# Patient Record
Sex: Female | Born: 1960 | Race: White | Hispanic: No | Marital: Married | Smoking: Never smoker
Health system: Southern US, Community
[De-identification: ages and names within clinical notes are randomized; demographics above are authoritative.]

## PROBLEM LIST (undated history)

## (undated) DIAGNOSIS — K5900 Constipation, unspecified: Secondary | ICD-10-CM

## (undated) DIAGNOSIS — K219 Gastro-esophageal reflux disease without esophagitis: Secondary | ICD-10-CM

## (undated) DIAGNOSIS — R112 Nausea with vomiting, unspecified: Secondary | ICD-10-CM

## (undated) DIAGNOSIS — E079 Disorder of thyroid, unspecified: Secondary | ICD-10-CM

## (undated) DIAGNOSIS — Z9889 Other specified postprocedural states: Secondary | ICD-10-CM

## (undated) HISTORY — PX: HERNIA REPAIR: SHX51

## (undated) HISTORY — DX: Gastro-esophageal reflux disease without esophagitis: K21.9

## (undated) HISTORY — PX: ANKLE SURGERY: SHX546

---

## 2003-01-24 ENCOUNTER — Other Ambulatory Visit: Admission: RE | Admit: 2003-01-24 | Discharge: 2003-01-24 | Payer: Self-pay | Admitting: Family Medicine

## 2004-01-29 ENCOUNTER — Other Ambulatory Visit: Admission: RE | Admit: 2004-01-29 | Discharge: 2004-01-29 | Payer: Self-pay | Admitting: Family Medicine

## 2005-04-21 ENCOUNTER — Other Ambulatory Visit: Admission: RE | Admit: 2005-04-21 | Discharge: 2005-04-21 | Payer: Self-pay | Admitting: Family Medicine

## 2007-04-18 ENCOUNTER — Other Ambulatory Visit: Admission: RE | Admit: 2007-04-18 | Discharge: 2007-04-18 | Payer: Self-pay | Admitting: Family Medicine

## 2013-09-12 ENCOUNTER — Other Ambulatory Visit: Payer: Self-pay | Admitting: Family Medicine

## 2013-09-12 ENCOUNTER — Other Ambulatory Visit (HOSPITAL_COMMUNITY)
Admission: RE | Admit: 2013-09-12 | Discharge: 2013-09-12 | Disposition: A | Discharge status: Home / Self Care | Payer: 59 | Source: Ambulatory Visit | Attending: Family Medicine | Admitting: Family Medicine

## 2013-09-12 DIAGNOSIS — Z1151 Encounter for screening for human papillomavirus (HPV): Secondary | ICD-10-CM | POA: Insufficient documentation

## 2013-09-12 DIAGNOSIS — Z124 Encounter for screening for malignant neoplasm of cervix: Secondary | ICD-10-CM | POA: Insufficient documentation

## 2015-12-03 ENCOUNTER — Encounter: Payer: Self-pay | Admitting: Dietician

## 2015-12-03 ENCOUNTER — Encounter: Payer: Commercial Managed Care - HMO | Attending: General Surgery | Admitting: Dietician

## 2015-12-03 DIAGNOSIS — Z01818 Encounter for other preprocedural examination: Secondary | ICD-10-CM | POA: Diagnosis present

## 2015-12-03 NOTE — Progress Notes (Signed)
  Pre-Op Assessment Visit:  Pre-Operative Sleeve gastrectomy Surgery  Medical Nutrition Therapy:  Appt start time: 1000   End time:  [id number].  Patient was seen on 12/03/2015 for Pre-Operative Nutrition Assessment. Assessment and letter of approval faxed to [facility] Surgery Bariatric Surgery Program coordinator on 12/03/2015.   Preferred Learning Style:   No preference indicated   Learning Readiness:   Ready  Handouts given during visit include:  Pre-Op Goals Bariatric Surgery Protein Shakes   During the appointment today the following Pre-Op Goals were reviewed with the patient: Maintain or lose weight as instructed by your surgeon Make healthy food choices Begin to limit portion sizes Limited concentrated sugars and fried foods Keep fat/sugar in the single digits per serving on   food labels Practice CHEWING your food  (aim for 30 chews per bite or until applesauce consistency) Practice not drinking 15 minutes before, during, and 30 minutes after each meal/snack Avoid all carbonated beverages  Avoid/limit caffeinated beverages  Avoid all sugar-sweetened beverages Consume 3 meals per day; eat every 3-5 hours Make a list of non-food related activities Aim for 64-100 ounces of FLUID daily  Aim for at least 60-80 grams of PROTEIN daily Look for a liquid protein source that contain ?15 g protein and ?5 g carbohydrate  (ex: shakes, drinks, shots)  Patient-Centered Goals:  -More active -Being more comfortable socially and professionally -Playing with grandkids -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

## 2015-12-30 ENCOUNTER — Encounter: Payer: Commercial Managed Care - HMO | Attending: General Surgery | Admitting: Dietician

## 2015-12-30 DIAGNOSIS — Z01818 Encounter for other preprocedural examination: Secondary | ICD-10-CM | POA: Insufficient documentation

## 2015-12-30 NOTE — Progress Notes (Signed)
Supervised Weight Loss:  Appt start time: 1100 end time:  1115  SWL visit 1:  Primary concerns today: Hannah Skinner returns for her first SWL visit in preparation for sleeve gastrectomy having maintained her weight. She states that she has been trying to focus more on protein foods. Practicing chewing thoroughly and working on drinking more water. Bought some Premier protein shakes but has not tried them yet.    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   Nutritional Diagnosis:  [place]-3.3 Overweight/obesity related to past poor dietary habits and physical inactivity as evidenced by patient in SWL for pending bariatric surgery following dietary guidelines for continued weight loss.     Intervention:  Nutrition counseling provided. Goals: -Increase physical activity  -Aim for 1 mile 3x a week in the evenings  -Ride bike when you are unable to walk -Keep working on chewing well, eating slowly, and taking tiny bites -Keep working on staying hydrated throughout the day (water or water with sugar free flavoring) -Start working on [employer] out [employer] given during visit include:  none  Monitoring/Evaluation:  Dietary intake, exercise, and body weight in 4 week(s).

## 2015-12-30 NOTE — Patient Instructions (Addendum)
-  Increase physical activity  -Aim for 1 mile 3x a week in the evenings  -Ride bike when you are unable to walk  -Keep working on chewing well, eating slowly, and taking tiny bites  -Keep working on staying hydrated throughout the day (water or water with sugar free flavoring) -Start working on [employer] out [employer]

## 2015-12-31 ENCOUNTER — Ambulatory Visit: Payer: Commercial Managed Care - HMO | Admitting: Dietician

## 2016-02-03 ENCOUNTER — Other Ambulatory Visit: Payer: Self-pay | Admitting: Physician Assistant

## 2016-02-03 DIAGNOSIS — R1313 Dysphagia, pharyngeal phase: Secondary | ICD-10-CM

## 2016-02-12 ENCOUNTER — Ambulatory Visit
Admission: RE | Admit: 2016-02-12 | Discharge: 2016-02-12 | Disposition: A | Discharge status: Home / Self Care | Payer: Commercial Managed Care - HMO | Source: Ambulatory Visit | Attending: Physician Assistant | Admitting: Physician Assistant

## 2016-02-12 DIAGNOSIS — R1313 Dysphagia, pharyngeal phase: Secondary | ICD-10-CM

## 2016-03-02 ENCOUNTER — Ambulatory Visit: Payer: Commercial Managed Care - HMO | Admitting: Dietician

## 2016-03-14 ENCOUNTER — Encounter (HOSPITAL_COMMUNITY): Payer: Self-pay | Admitting: *Deleted

## 2016-03-14 NOTE — Progress Notes (Signed)
Pt denies cardiac disease, chest pain or sob.

## 2016-03-15 ENCOUNTER — Encounter (HOSPITAL_COMMUNITY)
Admission: RE | Disposition: A | Discharge status: Home / Self Care | Payer: Self-pay | Source: Ambulatory Visit | Attending: Gastroenterology

## 2016-03-15 ENCOUNTER — Ambulatory Visit (HOSPITAL_COMMUNITY): Payer: Commercial Managed Care - HMO | Admitting: Certified Registered Nurse Anesthetist

## 2016-03-15 ENCOUNTER — Ambulatory Visit (HOSPITAL_COMMUNITY)
Admission: RE | Admit: 2016-03-15 | Discharge: 2016-03-15 | Disposition: A | Discharge status: Home / Self Care | Payer: Commercial Managed Care - HMO | Source: Ambulatory Visit | Attending: Gastroenterology | Admitting: Gastroenterology

## 2016-03-15 ENCOUNTER — Encounter (HOSPITAL_COMMUNITY): Payer: Self-pay

## 2016-03-15 DIAGNOSIS — K645 Perianal venous thrombosis: Secondary | ICD-10-CM | POA: Insufficient documentation

## 2016-03-15 DIAGNOSIS — K219 Gastro-esophageal reflux disease without esophagitis: Secondary | ICD-10-CM | POA: Diagnosis not present

## 2016-03-15 DIAGNOSIS — Z1211 Encounter for screening for malignant neoplasm of colon: Secondary | ICD-10-CM | POA: Insufficient documentation

## 2016-03-15 DIAGNOSIS — Z6841 Body Mass Index (BMI) 40.0 and over, adult: Secondary | ICD-10-CM | POA: Diagnosis not present

## 2016-03-15 HISTORY — PX: COLONOSCOPY WITH PROPOFOL: SHX5780

## 2016-03-15 HISTORY — DX: Nausea with vomiting, unspecified: R11.2

## 2016-03-15 HISTORY — DX: Constipation, unspecified: K59.00

## 2016-03-15 HISTORY — DX: Disorder of thyroid, unspecified: E07.9

## 2016-03-15 HISTORY — DX: Other specified postprocedural states: Z98.890

## 2016-03-15 SURGERY — COLONOSCOPY WITH PROPOFOL
Anesthesia: Monitor Anesthesia Care

## 2016-03-15 MED ORDER — LACTATED RINGERS IV SOLN
INTRAVENOUS | Status: DC | PRN
  Administered 2016-03-15: 09:00:00 via INTRAVENOUS

## 2016-03-15 MED ORDER — PROMETHAZINE HCL 25 MG/ML IJ SOLN: 6.2500 mg | INTRAMUSCULAR | Status: DC | PRN

## 2016-03-15 MED ORDER — FENTANYL CITRATE (PF) 100 MCG/2ML IJ SOLN: 25.0000 ug | INTRAMUSCULAR | Status: DC | PRN

## 2016-03-15 MED ORDER — PROPOFOL 500 MG/50ML IV EMUL
INTRAVENOUS | Status: DC | PRN
  Administered 2016-03-15: 75 ug/kg/min via INTRAVENOUS

## 2016-03-15 MED ORDER — PROPOFOL 10 MG/ML IV BOLUS
INTRAVENOUS | Status: DC | PRN
  Administered 2016-03-15: 10 mg via INTRAVENOUS

## 2016-03-15 MED ORDER — SODIUM CHLORIDE 0.9 % IV SOLN: INTRAVENOUS | Status: DC

## 2016-03-15 MED ORDER — LIDOCAINE HCL (CARDIAC) 20 MG/ML IV SOLN
INTRAVENOUS | Status: DC | PRN
  Administered 2016-03-15: 50 mg via INTRATRACHEAL

## 2016-03-15 NOTE — Op Note (Signed)
[facility] Patient Name: Hannah Skinner Procedure Date : 03/15/2016 MRN: [id number] Attending MD: Wonda Horner , MD Date of Birth: 1960-11-19 CSN: [id number] Age: 55 Admit Type: Outpatient Procedure:                Colonoscopy Indications:              Screening for colorectal malignant neoplasm Providers:                Wonda Horner, MD, Cleda Daub, RN, Janie                            Billups, Technician, Tressia Miners, CRNA Referring MD:              Medicines:                Propofol per Anesthesia Complications:            No immediate complications. Estimated Blood Loss:     Estimated blood loss: none. Procedure:                Pre-Anesthesia Assessment:                           - Prior to the procedure, a History and Physical                            was performed, and patient medications and                            allergies were reviewed. The patient's tolerance of                            previous anesthesia was also reviewed. The risks                            and benefits of the procedure and the sedation                            options and risks were discussed with the patient.                            All questions were answered, and informed consent                            was obtained. Prior Anticoagulants: The patient has                            taken no previous anticoagulant or antiplatelet                            agents. ASA Grade Assessment: III - A patient with                            severe systemic disease. After reviewing the risks  and benefits, the patient was deemed in                            satisfactory condition to undergo the procedure.                           After obtaining informed consent, the colonoscope                            was passed under direct vision. Throughout the                            procedure, the patient's blood pressure, pulse, and               oxygen saturations were monitored continuously. The                            [id number] scope was introduced through                            the anus and advanced to the the cecum, identified                            by appendiceal orifice and ileocecal valve. The                            ileocecal valve, appendiceal orifice, and rectum                            were photographed. The colonoscopy was performed                            without difficulty. The patient tolerated the                            procedure well. The quality of the bowel                            preparation was good. Scope In: 9:32:25 AM Scope Out: 9:52:59 AM Scope Withdrawal Time: 0 hours 9 minutes 9 seconds  Total Procedure Duration: 0 hours 20 minutes 34 seconds  Findings:      The perianal exam findings include thrombosed external hemorrhoids.      The colon (entire examined portion) appeared normal. Impression:               - Thrombosed external hemorrhoids found on perianal                            exam.                           - The entire examined colon is normal.                           - No specimens collected. Moderate Sedation:      .  Recommendation:           - Resume regular diet.                           - Continue present medications.                           - Repeat colonoscopy in 10 years for screening                            purposes. Procedure Code(s):        --- Professional ---                           [phone], Colonoscopy, flexible; diagnostic, including                            collection of specimen(s) by brushing or washing,                            when performed (separate procedure) Diagnosis Code(s):        --- Professional ---                           Z12.11, Encounter for screening for malignant                            neoplasm of colon                           K64.5, Perianal venous thrombosis CPT copyright 2016 American  Medical Association. All rights reserved. The codes documented in this report are preliminary and upon coder review may  be revised to meet current compliance requirements. Wonda Horner, MD 03/15/2016 9:58:11 AM This report has been signed electronically. Number of Addenda: 0

## 2016-03-15 NOTE — Anesthesia Procedure Notes (Signed)
Procedure Name: MAC Date/Time: 03/15/2016 9:15 AM Performed by: Tressia Miners LEFFEW Pre-anesthesia Checklist: Patient identified, Emergency Drugs available, Suction available, Timeout performed and Patient being monitored Patient Re-evaluated:Patient Re-evaluated prior to inductionOxygen Delivery Method: Simple face mask Placement Confirmation: positive ETCO2

## 2016-03-15 NOTE — H&P (Signed)
The patient is a 55 year old female who presents to the outpatient endoscopy department at the hospital for screening colonoscopy. This is her first screening colonoscopy. She is asymptomatic.  Physical  No acute distress  Heart regular rhythm no murmurs  Lungs clear  Abdomen is soft and nontender  Impression: 55 year old female referred for screening colonoscopy  Plan: Screening colonoscopy

## 2016-03-15 NOTE — Transfer of Care (Signed)
Immediate Anesthesia Transfer of Care Note  Patient: Hannah Skinner  Procedure(s) Performed: Procedure(s): COLONOSCOPY WITH PROPOFOL (N/A)  Patient Location: Endoscopy Unit  Anesthesia Type:MAC  Level of Consciousness: awake, alert , oriented, patient cooperative and responds to stimulation  Airway & Oxygen Therapy: Patient Spontanous Breathing and Patient connected to face mask oxygen  Post-op Assessment: Report given to RN, Post -op Vital signs reviewed and stable and Patient moving all extremities X 4  Post vital signs: Reviewed and stable  Last Vitals:  Vitals:   03/15/16 0756 03/15/16 1000  BP: 117/65 116/75  Pulse: 83 72  Resp: (!) 7 16    Last Pain:  Vitals:   03/15/16 0756  TempSrc: Oral         Complications: No apparent anesthesia complications

## 2016-03-15 NOTE — Discharge Instructions (Signed)
YOU HAD AN ENDOSCOPIC PROCEDURE TODAY: Refer to the procedure report and other information in the discharge instructions given to you for any specific questions about what was found during the examination. If this information does not answer your questions, please call [facility] office at [phone] to clarify.   YOU SHOULD EXPECT: Some feelings of bloating in the abdomen. Passage of more gas than usual. Walking can help get rid of the air that was put into your GI tract during the procedure and reduce the bloating. If you had a lower endoscopy (such as a colonoscopy or flexible sigmoidoscopy) you may notice spotting of blood in your stool or on the toilet paper. Some abdominal soreness may be present for a day or two, also.  DIET: Your first meal following the procedure should be a light meal and then it is ok to progress to your normal diet. A half-sandwich or bowl of soup is an example of a good first meal. Heavy or fried foods are harder to digest and may make you feel nauseous or bloated. Drink plenty of fluids but you should avoid alcoholic beverages for 24 hours. If you had a esophageal dilation, please see attached instructions for diet.   ACTIVITY: Your care partner should take you home directly after the procedure. You should plan to take it easy, moving slowly for the rest of the day. You can resume normal activity the day after the procedure however YOU SHOULD NOT DRIVE, use power tools, machinery or perform tasks that involve climbing or major physical exertion for 24 hours (because of the sedation medicines used during the test).   SYMPTOMS TO REPORT IMMEDIATELY: A gastroenterologist can be reached at any hour. Please call [phone]  for any of the following symptoms:  Following lower endoscopy (colonoscopy, flexible sigmoidoscopy) Excessive amounts of blood in the stool  Significant tenderness, worsening of abdominal pains  Swelling of the abdomen that is new, acute  Fever of 100 or  higher  Following upper endoscopy (EGD, EUS, ERCP, esophageal dilation) Vomiting of blood or coffee ground material  New, significant abdominal pain  New, significant chest pain or pain under the shoulder blades  Painful or persistently difficult swallowing  New shortness of breath  Black, tarry-looking or red, bloody stools  FOLLOW UP:  If any biopsies were taken you will be contacted by phone or by letter within the next 1-3 weeks. Call [phone]  if you have not heard about the biopsies in 3 weeks.  Please also call with any specific questions about appointments or follow up tests.    Colonoscopy, Care After These instructions give you information on caring for yourself after your procedure. Your doctor may also give you more specific instructions. Call your doctor if you have any problems or questions after your procedure. HOME CARE  Do not drive for 24 hours.  Do not sign important papers or use machinery for 24 hours.  You may shower.  You may go back to your usual activities, but go slower for the first 24 hours.  Take rest breaks often during the first 24 hours.  Walk around or use warm packs on your belly (abdomen) if you have belly cramping or gas.  Drink enough fluids to keep your pee (urine) clear or pale yellow.  Resume your normal diet. Avoid heavy or fried foods.  Avoid drinking alcohol for 24 hours or as told by your doctor.  Only take medicines as told by your doctor. If a tissue sample (biopsy) was  taken during the procedure:   Do not take aspirin or blood thinners for 7 days, or as told by your doctor.  Do not drink alcohol for 7 days, or as told by your doctor.  Eat soft foods for the first 24 hours. GET HELP IF: You still have a small amount of blood in your poop (stool) 2-3 days after the procedure. GET HELP RIGHT AWAY IF:  You have more than a small amount of blood in your poop.  You see clumps of tissue (blood clots) in your poop.  Your  belly is puffy (swollen).  You feel sick to your stomach (nauseous) or throw up (vomit).  You have a fever.  You have belly pain that gets worse and medicine does not help. MAKE SURE YOU:  Understand these instructions.  Will watch your condition.  Will get help right away if you are not doing well or get worse.   This information is not intended to replace advice given to you by your health care provider. Make sure you discuss any questions you have with your health care provider.   Document Released: 07/16/2010 Document Revised: 06/18/2013 Document Reviewed: 02/18/2013 Elsevier Interactive Patient Education 2016 [place] Monitored anesthesia care is an anesthesia service for a medical procedure. Anesthesia is the loss of the ability to feel pain. It is produced by medicines called anesthetics. It may affect a small area of your body (local anesthesia), a large area of your body (regional anesthesia), or your entire body (general anesthesia). The need for monitored anesthesia care depends your procedure, your condition, and the potential need for regional or general anesthesia. It is often provided during procedures where:   General anesthesia may be needed if there are complications. This is because you need special care when you are under general anesthesia.   You will be under local or regional anesthesia. This is so that you are able to have higher levels of anesthesia if needed.   You will receive calming medicines (sedatives). This is especially the case if sedatives are given to put you in a semi-conscious state of relaxation (deep sedation). This is because the amount of sedative needed to produce this state can be hard to predict. Too much of a sedative can produce general anesthesia. Monitored anesthesia care is performed by one or more health care providers who have special training in all types of anesthesia. You will need to meet with  these health care providers before your procedure. During this meeting, they will ask you about your medical history. They will also give you instructions to follow. (For example, you will need to stop eating and drinking before your procedure. You may also need to stop or change medicines you are taking.) During your procedure, your health care providers will stay with you. They will:   Watch your condition. This includes watching your blood pressure, breathing, and level of pain.   Diagnose and treat problems that occur.   Give medicines if they are needed. These may include calming medicines (sedatives) and anesthetics.   Make sure you are comfortable.  Having monitored anesthesia care does not necessarily mean that you will be under anesthesia. It does mean that your health care providers will be able to manage anesthesia if you need it or if it occurs. It also means that you will be able to have a different type of anesthesia than you are having if you need it. When your procedure is complete, your  health care providers will continue to watch your condition. They will make sure any medicines wear off before you are allowed to go home.    This information is not intended to replace advice given to you by your health care provider. Make sure you discuss any questions you have with your health care provider.   Document Released: 03/09/2005 Document Revised: 07/04/2014 Document Reviewed: 07/25/2012 Elsevier Interactive Patient Education [employer].

## 2016-03-15 NOTE — Anesthesia Preprocedure Evaluation (Addendum)
Anesthesia Evaluation  Patient identified by MRN, date of birth, ID band Patient awake    Reviewed: Allergy & Precautions, NPO status , Patient's Chart, lab work & pertinent test results  History of Anesthesia Complications (+) PONV and history of anesthetic complications  Airway Mallampati: III  TM Distance: >3 FB Neck ROM: Full    Dental  (+) Teeth Intact, Dental Advisory Given   Pulmonary neg pulmonary ROS,    Pulmonary exam normal breath sounds clear to auscultation       Cardiovascular negative cardio ROS   Rhythm:Regular Rate:Normal     Neuro/Psych negative neurological ROS     GI/Hepatic Neg liver ROS, GERD  Controlled,  Endo/Other  negative endocrine ROSHypothyroidism (has been off of medication for 8 months) Morbid obesity  Renal/GU negative Renal ROS     Musculoskeletal   Abdominal (+) + obese,   Peds  Hematology negative hematology ROS (+)   Anesthesia Other Findings   Reproductive/Obstetrics                            Anesthesia Physical Anesthesia Plan  ASA: III  Anesthesia Plan: MAC   Post-op Pain Management:    Induction: Intravenous  Airway Management Planned: Natural Airway and Nasal Cannula  Additional Equipment:   Intra-op Plan:   Post-operative Plan: Extubation in OR  Informed Consent: I have reviewed the patients History and Physical, chart, labs and discussed the procedure including the risks, benefits and alternatives for the proposed anesthesia with the patient or authorized representative who has indicated his/her understanding and acceptance.   Dental advisory given  Plan Discussed with: CRNA  Anesthesia Plan Comments:        Anesthesia Quick Evaluation

## 2016-03-15 NOTE — Anesthesia Postprocedure Evaluation (Signed)
Anesthesia Post Note  Patient: Hannah Skinner  Procedure(s) Performed: Procedure(s) (LRB): COLONOSCOPY WITH PROPOFOL (N/A)  Patient location during evaluation: PACU Anesthesia Type: MAC Level of consciousness: awake and alert Pain management: pain level controlled Vital Signs Assessment: post-procedure vital signs reviewed and stable Respiratory status: spontaneous breathing, nonlabored ventilation and respiratory function stable Cardiovascular status: stable and blood pressure returned to baseline Anesthetic complications: no    Last Vitals:  Vitals:   03/15/16 1010 03/15/16 1019  BP: 131/79 (!) 101/55  Pulse: 71 66  Resp: 14 (!) 21    Last Pain:  Vitals:   03/15/16 0756  TempSrc: Oral                 Nilda Simmer

## 2016-03-16 ENCOUNTER — Encounter (HOSPITAL_COMMUNITY): Payer: Self-pay | Admitting: Gastroenterology

## 2016-08-09 ENCOUNTER — Other Ambulatory Visit (HOSPITAL_COMMUNITY): Payer: Self-pay | Admitting: General Surgery

## 2016-08-29 ENCOUNTER — Ambulatory Visit (HOSPITAL_COMMUNITY)
Admission: RE | Admit: 2016-08-29 | Discharge: 2016-08-29 | Disposition: A | Discharge status: Home / Self Care | Payer: Commercial Managed Care - HMO | Source: Ambulatory Visit | Attending: General Surgery | Admitting: General Surgery

## 2016-08-29 ENCOUNTER — Other Ambulatory Visit: Payer: Self-pay

## 2016-08-29 DIAGNOSIS — I252 Old myocardial infarction: Secondary | ICD-10-CM | POA: Diagnosis not present

## 2016-08-29 DIAGNOSIS — I700 Atherosclerosis of aorta: Secondary | ICD-10-CM | POA: Insufficient documentation

## 2016-08-29 DIAGNOSIS — K449 Diaphragmatic hernia without obstruction or gangrene: Secondary | ICD-10-CM | POA: Diagnosis not present

## 2016-08-29 DIAGNOSIS — Z01812 Encounter for preprocedural laboratory examination: Secondary | ICD-10-CM | POA: Diagnosis not present

## 2016-08-29 DIAGNOSIS — Z01818 Encounter for other preprocedural examination: Secondary | ICD-10-CM | POA: Diagnosis present

## 2016-09-19 ENCOUNTER — Encounter: Payer: 59 | Attending: General Surgery | Admitting: Skilled Nursing Facility1

## 2016-09-19 DIAGNOSIS — Z713 Dietary counseling and surveillance: Secondary | ICD-10-CM | POA: Insufficient documentation

## 2016-09-19 DIAGNOSIS — E669 Obesity, unspecified: Secondary | ICD-10-CM

## 2016-09-19 DIAGNOSIS — Z6841 Body Mass Index (BMI) 40.0 and over, adult: Secondary | ICD-10-CM | POA: Diagnosis not present

## 2016-09-22 ENCOUNTER — Encounter: Payer: Self-pay | Admitting: Skilled Nursing Facility1

## 2016-09-22 NOTE — Progress Notes (Signed)
  Pre-Operative Nutrition Class: 09/19/2016 Appt start time: 1610   End time:  1830.  Patient was seen on 09/19/2016 for Pre-Operative Bariatric Surgery Education at [facility].   Surgery date: 10/04/2016 Surgery type: gastric sleeve Start weight at [facility]: 312 Weight today: 314.8   TANITA  BODY COMP RESULTS     BMI (kg/m^2)    Fat Mass (lbs)    Fat Free Mass (lbs)    Total Body Water (lbs)    Samples given per MNT protocol. Patient educated on appropriate usage: Opurity Multivitamin Lot # [id number] Exp:09/13/2017  Celebrate Vitamins Calcium Citrate Lot # [id number] Exp: 09/2017  Premeir Protein  Lot #[id number] Exp: 03/nov/2018  The following the learning objectives were met by the patient during this course:  Identify Pre-Op Dietary Goals and will begin 2 weeks pre-operatively  Identify appropriate sources of fluids and proteins   State protein recommendations and appropriate sources pre and post-operatively  Identify Post-Operative Dietary Goals and will follow for 2 weeks post-operatively  Identify appropriate multivitamin and calcium sources  Describe the need for physical activity post-operatively and will follow MD recommendations  State when to call healthcare provider regarding medication questions or post-operative complications  Handouts given during class include:  Pre-Op Bariatric Surgery Diet Handout  Protein Shake Handout  Post-Op Bariatric Surgery Nutrition Handout  [facility] Program Information Flyer  Support Group Information Flyer  [facility] Bariatric Supplements Price List  Follow-Up Plan: Patient will follow-up at [facility] 2 weeks post operatively for diet advancement per MD.

## 2016-09-29 ENCOUNTER — Ambulatory Visit: Payer: Self-pay | Admitting: General Surgery

## 2016-09-29 NOTE — Progress Notes (Signed)
CXR 08-29-16 epic EKG 08-29-16 epic

## 2016-09-29 NOTE — Progress Notes (Signed)
Please place orders in epic for surgery on 10-04-16. Patient has pre-op appt on 10-03-16

## 2016-09-29 NOTE — Patient Instructions (Addendum)
Hannah Skinner  09/29/2016   Your procedure is scheduled on: 10-04-16  Report to [facility] Main  Entrance take [facility]  elevators to 3rd floor to  [place] at 530AM.  Call this number if you have problems the morning of surgery [phone]   Remember: ONLY 1 PERSON MAY GO WITH YOU TO SHORT STAY TO GET  READY MORNING OF [place].  Do not eat food or drink liquids :After Midnight.     Take these medicines the morning of surgery with A SIP OF WATER: Claritin if needed                             You may not have any metal on your body including hair pins and              piercings  Do not wear jewelry, make-up, lotions, powders or perfumes, deodorant             Do not wear nail polish.  Do not shave  48 hours prior to surgery.          Do not bring valuables to the hospital. [place].  Contacts, dentures or bridgework may not be worn into surgery.  Leave suitcase in the car. After surgery it may be brought to your room.               Please read over the following fact sheets you were given: _____________________________________________________________________             [facility] - Preparing for Surgery Before surgery, you can play an important role.  Because skin is not sterile, your skin needs to be as free of germs as possible.  You can reduce the number of germs on your skin by washing with CHG (chlorahexidine gluconate) soap before surgery.  CHG is an antiseptic cleaner which kills germs and bonds with the skin to continue killing germs even after washing. Please DO NOT use if you have an allergy to CHG or antibacterial soaps.  If your skin becomes reddened/irritated stop using the CHG and inform your nurse when you arrive at Short Stay. Do not shave (including legs and underarms) for at least 48 hours prior to the first CHG shower.  You may shave your face/neck. Please follow these  instructions carefully:  1.  Shower with CHG Soap the night before surgery and the  morning of Surgery.  2.  If you choose to wash your hair, wash your hair first as usual with your  normal  shampoo.  3.  After you shampoo, rinse your hair and body thoroughly to remove the  shampoo.                           4.  Use CHG as you would any other liquid soap.  You can apply chg directly  to the skin and wash                       Gently with a scrungie or clean washcloth.  5.  Apply the CHG Soap to your body ONLY FROM THE NECK DOWN.   Do not use on face/ open  Wound or open sores. Avoid contact with eyes, ears mouth and genitals (private parts).                       Wash face,  Genitals (private parts) with your normal soap.             6.  Wash thoroughly, paying special attention to the area where your surgery  will be performed.  7.  Thoroughly rinse your body with warm water from the neck down.  8.  DO NOT shower/wash with your normal soap after using and rinsing off  the CHG Soap.                9.  Pat yourself dry with a clean towel.            10.  Wear clean pajamas.            11.  Place clean sheets on your bed the night of your first shower and do not  sleep with pets. Day of Surgery : Do not apply any lotions/deodorants the morning of surgery.  Please wear clean clothes to the hospital/surgery center.  FAILURE TO FOLLOW THESE INSTRUCTIONS MAY RESULT IN THE CANCELLATION OF YOUR SURGERY PATIENT SIGNATURE_________________________________  NURSE SIGNATURE__________________________________  ________________________________________________________________________

## 2016-10-03 ENCOUNTER — Encounter (HOSPITAL_COMMUNITY)
Admission: RE | Admit: 2016-10-03 | Discharge: 2016-10-03 | Disposition: A | Discharge status: Home / Self Care | Payer: 59 | Source: Ambulatory Visit | Attending: General Surgery | Admitting: General Surgery

## 2016-10-03 ENCOUNTER — Encounter (HOSPITAL_COMMUNITY): Payer: Self-pay

## 2016-10-03 DIAGNOSIS — K219 Gastro-esophageal reflux disease without esophagitis: Secondary | ICD-10-CM | POA: Diagnosis not present

## 2016-10-03 DIAGNOSIS — Z6841 Body Mass Index (BMI) 40.0 and over, adult: Secondary | ICD-10-CM | POA: Diagnosis not present

## 2016-10-03 DIAGNOSIS — K449 Diaphragmatic hernia without obstruction or gangrene: Secondary | ICD-10-CM | POA: Diagnosis not present

## 2016-10-03 DIAGNOSIS — I10 Essential (primary) hypertension: Secondary | ICD-10-CM | POA: Diagnosis not present

## 2016-10-03 DIAGNOSIS — Z820 Family history of epilepsy and other diseases of the nervous system: Secondary | ICD-10-CM | POA: Diagnosis not present

## 2016-10-03 DIAGNOSIS — E039 Hypothyroidism, unspecified: Secondary | ICD-10-CM | POA: Diagnosis not present

## 2016-10-03 DIAGNOSIS — D1779 Benign lipomatous neoplasm of other sites: Secondary | ICD-10-CM | POA: Diagnosis not present

## 2016-10-03 LAB — COMPREHENSIVE METABOLIC PANEL
ALBUMIN: 4.5 g/dL (ref 3.5–5.0)
ALT: 21 U/L (ref 14–54)
ANION GAP: 9 (ref 5–15)
AST: 24 U/L (ref 15–41)
Alkaline Phosphatase: 70 U/L (ref 38–126)
BUN: 18 mg/dL (ref 6–20)
CHLORIDE: 107 mmol/L (ref 101–111)
CO2: 24 mmol/L (ref 22–32)
Calcium: 9.6 mg/dL (ref 8.9–10.3)
Creatinine, Ser: 0.92 mg/dL (ref 0.44–1.00)
GFR calc Af Amer: >60 mL/min (ref >60)
GFR calc non Af Amer: >60 mL/min (ref >60)
GLUCOSE: 98 mg/dL (ref 65–99)
POTASSIUM: 4.1 mmol/L (ref 3.5–5.1)
SODIUM: 140 mmol/L (ref 135–145)
Total Bilirubin: 0.6 mg/dL (ref 0.3–1.2)
Total Protein: 8 g/dL (ref 6.5–8.1)

## 2016-10-03 LAB — CBC WITH DIFFERENTIAL/PLATELET
BASOS ABS: 0 10*3/uL (ref 0.0–0.1)
BASOS PCT: 0 %
EOS ABS: 0.1 10*3/uL (ref 0.0–0.7)
Eosinophils Relative: 2 %
HCT: 38.9 % (ref 36.0–46.0)
Hemoglobin: 13.3 g/dL (ref 12.0–15.0)
Lymphocytes Relative: 25 %
Lymphs Abs: 1.4 10*3/uL (ref 0.7–4.0)
MCH: 27.9 pg (ref 26.0–34.0)
MCHC: 34.2 g/dL (ref 30.0–36.0)
MCV: 81.7 fL (ref 78.0–100.0)
MONO ABS: 0.4 10*3/uL (ref 0.1–1.0)
MONOS PCT: 8 %
Neutro Abs: 3.8 10*3/uL (ref 1.7–7.7)
Neutrophils Relative %: 65 %
Platelets: 286 10*3/uL (ref 150–400)
RBC: 4.76 MIL/uL (ref 3.87–5.11)
RDW: 13 % (ref 11.5–15.5)
WBC: 5.7 10*3/uL (ref 4.0–10.5)

## 2016-10-03 LAB — GLUCOSE, CAPILLARY: Glucose-Capillary: 105 mg/dL — ABNORMAL HIGH (ref 65–99)

## 2016-10-03 NOTE — Anesthesia Preprocedure Evaluation (Signed)
Anesthesia Evaluation  Patient identified by MRN, date of birth, ID band Patient awake    Reviewed: Allergy & Precautions, H&P , Patient's Chart, lab work & pertinent test results, reviewed documented beta blocker date and time   Airway Mallampati: II  TM Distance: >3 FB Neck ROM: full    Dental no notable dental hx.    Pulmonary    Pulmonary exam normal breath sounds clear to auscultation       Cardiovascular  Rhythm:regular Rate:Normal     Neuro/Psych    GI/Hepatic   Endo/Other    Renal/GU      Musculoskeletal   Abdominal   Peds  Hematology   Anesthesia Other Findings   Reproductive/Obstetrics                             Anesthesia Physical Anesthesia Plan  ASA: III  Anesthesia Plan: General   Post-op Pain Management:    Induction: Intravenous  Airway Management Planned: Oral ETT and Video Laryngoscope Planned  Additional Equipment:   Intra-op Plan:   Post-operative Plan: Extubation in OR  Informed Consent: I have reviewed the patients History and Physical, chart, labs and discussed the procedure including the risks, benefits and alternatives for the proposed anesthesia with the patient or authorized representative who has indicated his/her understanding and acceptance.   Dental Advisory Given  Plan Discussed with: CRNA and Surgeon  Anesthesia Plan Comments: (  )        Anesthesia Quick Evaluation

## 2016-10-04 ENCOUNTER — Encounter (HOSPITAL_COMMUNITY): Payer: Self-pay | Admitting: *Deleted

## 2016-10-04 ENCOUNTER — Observation Stay (HOSPITAL_COMMUNITY)
Admission: RE | Admit: 2016-10-04 | Discharge: 2016-10-05 | DRG: 621 | Disposition: A | Discharge status: Home / Self Care | Payer: 59 | Source: Ambulatory Visit | Attending: General Surgery | Admitting: General Surgery

## 2016-10-04 ENCOUNTER — Inpatient Hospital Stay (HOSPITAL_COMMUNITY): Payer: 59 | Admitting: Anesthesiology

## 2016-10-04 ENCOUNTER — Encounter (HOSPITAL_COMMUNITY)
Admission: RE | Disposition: A | Discharge status: Home / Self Care | Payer: Self-pay | Source: Ambulatory Visit | Attending: General Surgery

## 2016-10-04 DIAGNOSIS — K219 Gastro-esophageal reflux disease without esophagitis: Secondary | ICD-10-CM | POA: Insufficient documentation

## 2016-10-04 DIAGNOSIS — K449 Diaphragmatic hernia without obstruction or gangrene: Secondary | ICD-10-CM | POA: Insufficient documentation

## 2016-10-04 DIAGNOSIS — Z820 Family history of epilepsy and other diseases of the nervous system: Secondary | ICD-10-CM | POA: Insufficient documentation

## 2016-10-04 DIAGNOSIS — I10 Essential (primary) hypertension: Secondary | ICD-10-CM | POA: Insufficient documentation

## 2016-10-04 DIAGNOSIS — D1779 Benign lipomatous neoplasm of other sites: Secondary | ICD-10-CM | POA: Insufficient documentation

## 2016-10-04 DIAGNOSIS — E039 Hypothyroidism, unspecified: Secondary | ICD-10-CM | POA: Insufficient documentation

## 2016-10-04 DIAGNOSIS — Z6841 Body Mass Index (BMI) 40.0 and over, adult: Secondary | ICD-10-CM | POA: Insufficient documentation

## 2016-10-04 HISTORY — PX: LAPAROSCOPIC GASTRIC SLEEVE RESECTION WITH HIATAL HERNIA REPAIR: SHX6512

## 2016-10-04 LAB — PREGNANCY, URINE: PREG TEST UR: NEGATIVE

## 2016-10-04 LAB — CBC
HCT: 37.7 % (ref 36.0–46.0)
Hemoglobin: 12.7 g/dL (ref 12.0–15.0)
MCH: 27.9 pg (ref 26.0–34.0)
MCHC: 33.7 g/dL (ref 30.0–36.0)
MCV: 82.9 fL (ref 78.0–100.0)
PLATELETS: 247 10*3/uL (ref 150–400)
RBC: 4.55 MIL/uL (ref 3.87–5.11)
RDW: 13.1 % (ref 11.5–15.5)
WBC: 7.1 10*3/uL (ref 4.0–10.5)

## 2016-10-04 LAB — CREATININE, SERUM
CREATININE: 1.25 mg/dL — AB (ref 0.44–1.00)
GFR calc non Af Amer: 48 mL/min — ABNORMAL LOW (ref >60)
GFR, EST AFRICAN AMERICAN: 56 mL/min — AB (ref >60)

## 2016-10-04 LAB — HEMOGLOBIN AND HEMATOCRIT, BLOOD
HCT: 37.5 % (ref 36.0–46.0)
HEMOGLOBIN: 12.6 g/dL (ref 12.0–15.0)

## 2016-10-04 SURGERY — GASTRECTOMY, SLEEVE, LAPAROSCOPIC, WITH HIATAL HERNIA REPAIR
Anesthesia: General

## 2016-10-04 MED ORDER — ROCURONIUM BROMIDE 50 MG/5ML IV SOSY
PREFILLED_SYRINGE | INTRAVENOUS | Status: AC
  Filled 2016-10-04: qty 5

## 2016-10-04 MED ORDER — PANTOPRAZOLE SODIUM 40 MG IV SOLR
40.0000 mg | Freq: Every day | INTRAVENOUS | Status: DC
  Administered 2016-10-04: 40 mg via INTRAVENOUS
  Filled 2016-10-04: qty 40

## 2016-10-04 MED ORDER — CEFOTETAN DISODIUM-DEXTROSE 2-2.08 GM-% IV SOLR
2.0000 g | INTRAVENOUS | Status: AC
  Administered 2016-10-04: 2 g via INTRAVENOUS

## 2016-10-04 MED ORDER — HEPARIN SODIUM (PORCINE) 5000 UNIT/ML IJ SOLN
5000.0000 [IU] | INTRAMUSCULAR | Status: AC
  Administered 2016-10-04: 5000 [IU] via SUBCUTANEOUS
  Filled 2016-10-04: qty 1

## 2016-10-04 MED ORDER — SUCCINYLCHOLINE CHLORIDE 200 MG/10ML IV SOSY
PREFILLED_SYRINGE | INTRAVENOUS | Status: DC | PRN
  Administered 2016-10-04: 120 mg via INTRAVENOUS

## 2016-10-04 MED ORDER — ACETAMINOPHEN 325 MG PO TABS: 650.0000 mg | ORAL_TABLET | ORAL | Status: DC | PRN

## 2016-10-04 MED ORDER — FENTANYL CITRATE (PF) 250 MCG/5ML IJ SOLN
INTRAMUSCULAR | Status: AC
  Filled 2016-10-04: qty 5

## 2016-10-04 MED ORDER — SCOPOLAMINE 1 MG/3DAYS TD PT72
1.0000 | MEDICATED_PATCH | TRANSDERMAL | Status: DC
  Administered 2016-10-04: 1 via TRANSDERMAL

## 2016-10-04 MED ORDER — PREMIER PROTEIN SHAKE: 2.0000 [oz_av] | ORAL | Status: DC

## 2016-10-04 MED ORDER — SUGAMMADEX SODIUM 200 MG/2ML IV SOLN
INTRAVENOUS | Status: DC | PRN
  Administered 2016-10-04: 300 mg via INTRAVENOUS

## 2016-10-04 MED ORDER — MIDAZOLAM HCL 2 MG/2ML IJ SOLN
INTRAMUSCULAR | Status: AC
  Filled 2016-10-04: qty 2

## 2016-10-04 MED ORDER — BUPIVACAINE HCL 0.25 % IJ SOLN
INTRAMUSCULAR | Status: DC | PRN
  Administered 2016-10-04: 30 mL

## 2016-10-04 MED ORDER — BUPIVACAINE HCL (PF) 0.25 % IJ SOLN
INTRAMUSCULAR | Status: AC
  Filled 2016-10-04: qty 30

## 2016-10-04 MED ORDER — ONDANSETRON HCL 4 MG/2ML IJ SOLN: 4.0000 mg | INTRAMUSCULAR | Status: DC | PRN

## 2016-10-04 MED ORDER — SUGAMMADEX SODIUM 200 MG/2ML IV SOLN
INTRAVENOUS | Status: AC
  Filled 2016-10-04: qty 2

## 2016-10-04 MED ORDER — SODIUM CHLORIDE 0.9 % IV SOLN
INTRAVENOUS | Status: DC
  Administered 2016-10-04 – 2016-10-05 (×3): via INTRAVENOUS

## 2016-10-04 MED ORDER — BUPIVACAINE LIPOSOME 1.3 % IJ SUSP
20.0000 mL | Freq: Once | INTRAMUSCULAR | Status: AC
  Administered 2016-10-04: 20 mL
  Filled 2016-10-04: qty 20

## 2016-10-04 MED ORDER — FENTANYL CITRATE (PF) 250 MCG/5ML IJ SOLN
INTRAMUSCULAR | Status: DC | PRN
  Administered 2016-10-04 (×2): 25 ug via INTRAVENOUS
  Administered 2016-10-04 (×2): 50 ug via INTRAVENOUS

## 2016-10-04 MED ORDER — FENTANYL CITRATE (PF) 100 MCG/2ML IJ SOLN
25.0000 ug | INTRAMUSCULAR | Status: DC | PRN
  Administered 2016-10-04 (×2): 25 ug via INTRAVENOUS

## 2016-10-04 MED ORDER — ACETAMINOPHEN 500 MG PO TABS
1000.0000 mg | ORAL_TABLET | ORAL | Status: AC
  Administered 2016-10-04: 1000 mg via ORAL
  Filled 2016-10-04: qty 2

## 2016-10-04 MED ORDER — DEXAMETHASONE SODIUM PHOSPHATE 10 MG/ML IJ SOLN
INTRAMUSCULAR | Status: DC | PRN
  Administered 2016-10-04: 10 mg via INTRAVENOUS

## 2016-10-04 MED ORDER — OXYCODONE HCL 5 MG/5ML PO SOLN
5.0000 mg | ORAL | Status: DC | PRN
  Administered 2016-10-04: 5 mg via ORAL
  Filled 2016-10-04: qty 5

## 2016-10-04 MED ORDER — PROPOFOL 10 MG/ML IV BOLUS
INTRAVENOUS | Status: DC | PRN
  Administered 2016-10-04: 150 mg via INTRAVENOUS

## 2016-10-04 MED ORDER — LACTATED RINGERS IR SOLN
Status: DC | PRN
  Administered 2016-10-04: 1000 mL

## 2016-10-04 MED ORDER — GABAPENTIN 300 MG PO CAPS
300.0000 mg | ORAL_CAPSULE | ORAL | Status: AC
  Administered 2016-10-04: 300 mg via ORAL
  Filled 2016-10-04: qty 1

## 2016-10-04 MED ORDER — LIP MEDEX EX OINT
TOPICAL_OINTMENT | CUTANEOUS | Status: AC
  Filled 2016-10-04: qty 7

## 2016-10-04 MED ORDER — ONDANSETRON HCL 4 MG/2ML IJ SOLN
INTRAMUSCULAR | Status: AC
  Filled 2016-10-04: qty 2

## 2016-10-04 MED ORDER — CELECOXIB 200 MG PO CAPS
400.0000 mg | ORAL_CAPSULE | ORAL | Status: AC
  Administered 2016-10-04: 400 mg via ORAL
  Filled 2016-10-04: qty 2

## 2016-10-04 MED ORDER — SCOPOLAMINE 1 MG/3DAYS TD PT72
MEDICATED_PATCH | TRANSDERMAL | Status: AC
  Filled 2016-10-04: qty 1

## 2016-10-04 MED ORDER — ONDANSETRON HCL 4 MG/2ML IJ SOLN
INTRAMUSCULAR | Status: DC | PRN
  Administered 2016-10-04: 4 mg via INTRAVENOUS

## 2016-10-04 MED ORDER — DEXAMETHASONE SODIUM PHOSPHATE 10 MG/ML IJ SOLN
INTRAMUSCULAR | Status: AC
  Filled 2016-10-04: qty 1

## 2016-10-04 MED ORDER — ROCURONIUM BROMIDE 50 MG/5ML IV SOSY
PREFILLED_SYRINGE | INTRAVENOUS | Status: DC | PRN
  Administered 2016-10-04: 50 mg via INTRAVENOUS
  Administered 2016-10-04: 10 mg via INTRAVENOUS

## 2016-10-04 MED ORDER — LACTATED RINGERS IV SOLN
INTRAVENOUS | Status: DC | PRN
  Administered 2016-10-04: 07:00:00 via INTRAVENOUS

## 2016-10-04 MED ORDER — APREPITANT 40 MG PO CAPS
40.0000 mg | ORAL_CAPSULE | ORAL | Status: AC
  Administered 2016-10-04: 40 mg via ORAL
  Filled 2016-10-04: qty 1

## 2016-10-04 MED ORDER — ENOXAPARIN SODIUM 30 MG/0.3ML ~~LOC~~ SOLN
30.0000 mg | Freq: Two times a day (BID) | SUBCUTANEOUS | Status: DC
  Administered 2016-10-04 – 2016-10-05 (×2): 30 mg via SUBCUTANEOUS
  Filled 2016-10-04 (×2): qty 0.3

## 2016-10-04 MED ORDER — SUGAMMADEX SODIUM 500 MG/5ML IV SOLN
INTRAVENOUS | Status: AC
  Filled 2016-10-04: qty 5

## 2016-10-04 MED ORDER — LIDOCAINE 2% (20 MG/ML) 5 ML SYRINGE
INTRAMUSCULAR | Status: AC
  Filled 2016-10-04: qty 5

## 2016-10-04 MED ORDER — MORPHINE SULFATE (PF) 2 MG/ML IV SOLN: 1.0000 mg | INTRAVENOUS | Status: DC | PRN

## 2016-10-04 MED ORDER — SUCCINYLCHOLINE CHLORIDE 200 MG/10ML IV SOSY
PREFILLED_SYRINGE | INTRAVENOUS | Status: AC
  Filled 2016-10-04: qty 10

## 2016-10-04 MED ORDER — PROPOFOL 10 MG/ML IV BOLUS
INTRAVENOUS | Status: AC
  Filled 2016-10-04: qty 20

## 2016-10-04 MED ORDER — CEFOTETAN DISODIUM-DEXTROSE 2-2.08 GM-% IV SOLR
INTRAVENOUS | Status: AC
  Filled 2016-10-04: qty 50

## 2016-10-04 MED ORDER — FENTANYL CITRATE (PF) 100 MCG/2ML IJ SOLN
INTRAMUSCULAR | Status: AC
  Administered 2016-10-04: 25 ug via INTRAVENOUS
  Filled 2016-10-04: qty 2

## 2016-10-04 MED ORDER — MIDAZOLAM HCL 5 MG/5ML IJ SOLN
INTRAMUSCULAR | Status: DC | PRN
  Administered 2016-10-04: 2 mg via INTRAVENOUS

## 2016-10-04 MED ORDER — LIDOCAINE 2% (20 MG/ML) 5 ML SYRINGE
INTRAMUSCULAR | Status: DC | PRN
  Administered 2016-10-04: 80 mg via INTRAVENOUS

## 2016-10-04 MED ORDER — ACETAMINOPHEN 160 MG/5ML PO SOLN: 325.0000 mg | ORAL | Status: DC | PRN

## 2016-10-04 MED ORDER — ROCURONIUM BROMIDE 50 MG/5ML IV SOSY: PREFILLED_SYRINGE | INTRAVENOUS | Status: DC | PRN

## 2016-10-04 MED ORDER — LEVOTHYROXINE SODIUM 100 MCG PO TABS
100.0000 ug | ORAL_TABLET | Freq: Every day | ORAL | Status: DC
  Administered 2016-10-05: 100 ug via ORAL
  Filled 2016-10-04 (×2): qty 1

## 2016-10-04 SURGICAL SUPPLY — 66 items
APL SKNCLS STERI-STRIP NONHPOA (GAUZE/BANDAGES/DRESSINGS) ×1
APPLIER CLIP 5 13 M/L LIGAMAX5 (MISCELLANEOUS)
APPLIER CLIP ROT 10 11.4 M/L (STAPLE)
APPLIER CLIP ROT 13.4 12 LRG (CLIP)
APR CLP LRG 13.4X12 ROT 20 MLT (CLIP)
APR CLP MED LRG 11.4X10 (STAPLE)
APR CLP MED LRG 5 ANG JAW (MISCELLANEOUS)
BAG LAPAROSCOPIC 12 15 PORT 16 (BASKET) ×1 IMPLANT
BAG RETRIEVAL 12/15 (BASKET) ×2
BAG RETRIEVAL 12/15MM (BASKET) ×1
BANDAGE ADH SHEER 1  50/CT (GAUZE/BANDAGES/DRESSINGS) ×15 IMPLANT
BENZOIN TINCTURE PRP APPL 2/3 (GAUZE/BANDAGES/DRESSINGS) ×3 IMPLANT
BLADE SURG SZ11 CARB STEEL (BLADE) ×3 IMPLANT
CABLE HIGH FREQUENCY MONO STRZ (ELECTRODE) ×3 IMPLANT
CHLORAPREP W/TINT 26ML (MISCELLANEOUS) ×3 IMPLANT
CLIP APPLIE 5 13 M/L LIGAMAX5 (MISCELLANEOUS) IMPLANT
CLIP APPLIE ROT 10 11.4 M/L (STAPLE) IMPLANT
CLIP APPLIE ROT 13.4 12 LRG (CLIP) IMPLANT
CLOSURE WOUND 1/2 X4 (GAUZE/BANDAGES/DRESSINGS) ×1
COVER SURGICAL LIGHT HANDLE (MISCELLANEOUS) ×3 IMPLANT
DRAIN CHANNEL 19F RND (DRAIN) IMPLANT
ELECT REM PT RETURN 15FT ADLT (MISCELLANEOUS) ×3 IMPLANT
EVACUATOR SILICONE 100CC (DRAIN) IMPLANT
GAUZE SPONGE 4X4 12PLY STRL (GAUZE/BANDAGES/DRESSINGS) IMPLANT
GLOVE BIOGEL PI IND STRL 7.0 (GLOVE) ×1 IMPLANT
GLOVE BIOGEL PI INDICATOR 7.0 (GLOVE) ×2
GLOVE SURG SS PI 7.0 STRL IVOR (GLOVE) ×3 IMPLANT
GOWN STRL REUS W/TWL LRG LVL3 (GOWN DISPOSABLE) ×3 IMPLANT
GOWN STRL REUS W/TWL XL LVL3 (GOWN DISPOSABLE) ×9 IMPLANT
GRASPER SUT TROCAR 14GX15 (MISCELLANEOUS) ×3 IMPLANT
HANDLE STAPLE EGIA 4 XL (STAPLE) ×3 IMPLANT
HOVERMATT SINGLE USE (MISCELLANEOUS) ×3 IMPLANT
KIT BASIN OR (CUSTOM PROCEDURE TRAY) ×3 IMPLANT
MARKER SKIN DUAL TIP RULER LAB (MISCELLANEOUS) ×3 IMPLANT
NDL SPNL 22GX3.5 QUINCKE BK (NEEDLE) ×1 IMPLANT
NEEDLE SPNL 22GX3.5 QUINCKE BK (NEEDLE) ×3 IMPLANT
PACK CARDIOVASCULAR III (CUSTOM PROCEDURE TRAY) ×3 IMPLANT
RELOAD EGIA 45 MED/THCK PURPLE (STAPLE) ×2 IMPLANT
RELOAD STAPLE 45 PURP MED/THCK (STAPLE) IMPLANT
RELOAD TRI 45 ART MED THCK BLK (STAPLE) IMPLANT
RELOAD TRI 45 ART MED THCK PUR (STAPLE) IMPLANT
RELOAD TRI 60 ART MED THCK BLK (STAPLE) ×6 IMPLANT
RELOAD TRI 60 ART MED THCK PUR (STAPLE) ×2 IMPLANT
SCISSORS LAP 5X45 EPIX DISP (ENDOMECHANICALS) ×3 IMPLANT
SET IRRIG TUBING LAPAROSCOPIC (IRRIGATION / IRRIGATOR) ×3 IMPLANT
SHEARS HARMONIC ACE PLUS 45CM (MISCELLANEOUS) ×3 IMPLANT
SLEEVE GASTRECTOMY 40FR VISIGI (MISCELLANEOUS) ×3 IMPLANT
SLEEVE XCEL OPT CAN 5 100 (ENDOMECHANICALS) ×6 IMPLANT
SOLUTION ANTI FOG 6CC (MISCELLANEOUS) ×3 IMPLANT
SPONGE LAP 18X18 X RAY DECT (DISPOSABLE) ×3 IMPLANT
STRIP CLOSURE SKIN 1/2X4 (GAUZE/BANDAGES/DRESSINGS) ×2 IMPLANT
SUT ETHIBOND 0 36 GRN (SUTURE) ×10 IMPLANT
SUT ETHILON 2 0 PS N (SUTURE) IMPLANT
SUT MNCRL AB 4-0 PS2 18 (SUTURE) ×3 IMPLANT
SUT SILK 0 SH 30 (SUTURE) IMPLANT
SUT VICRYL 0 TIES 12 18 (SUTURE) ×3 IMPLANT
SYR 20CC LL (SYRINGE) ×3 IMPLANT
SYR 50ML LL SCALE MARK (SYRINGE) ×3 IMPLANT
TOWEL OR 17X26 10 PK STRL BLUE (TOWEL DISPOSABLE) ×3 IMPLANT
TOWEL OR NON WOVEN STRL DISP B (DISPOSABLE) ×3 IMPLANT
TROCAR BLADELESS 15MM (ENDOMECHANICALS) ×3 IMPLANT
TROCAR BLADELESS OPT 5 100 (ENDOMECHANICALS) ×3 IMPLANT
TUBING CONNECTING 10 (TUBING) ×2 IMPLANT
TUBING CONNECTING 10' (TUBING) ×1
TUBING ENDO SMARTCAP (MISCELLANEOUS) ×3 IMPLANT
TUBING INSUF HEATED (TUBING) ×3 IMPLANT

## 2016-10-04 NOTE — Transfer of Care (Signed)
Immediate Anesthesia Transfer of Care Note  Patient: Hannah Skinner  Procedure(s) Performed: Procedure(s): LAPAROSCOPIC GASTRIC SLEEVE RESECTION WITH HIATAL HERNIA REPAIR, UPPER ENDO (N/A)  Patient Location: PACU  Anesthesia Type:General  Level of Consciousness: awake, alert  and patient cooperative  Airway & Oxygen Therapy: Patient Spontanous Breathing and Patient connected to face mask oxygen  Post-op Assessment: Report given to RN and Post -op Vital signs reviewed and stable  Post vital signs: Reviewed and stable  Last Vitals:  Vitals:   10/04/16 0525  BP: 132/74  Pulse: 85  Resp: 18  Temp: 36.4 C    Last Pain:  Vitals:   10/04/16 0532  TempSrc:   PainSc: 0-No pain      Patients Stated Pain Goal: 3 (82/42/35 [id number])  Complications: No apparent anesthesia complications

## 2016-10-04 NOTE — Anesthesia Postprocedure Evaluation (Signed)
Anesthesia Post Note  Patient: Hannah Skinner  Procedure(s) Performed: Procedure(s) (LRB): LAPAROSCOPIC GASTRIC SLEEVE RESECTION WITH HIATAL HERNIA REPAIR, UPPER ENDO (N/A)  Patient location during evaluation: PACU Anesthesia Type: General Level of consciousness: awake and alert Pain management: pain level controlled Vital Signs Assessment: post-procedure vital signs reviewed and stable Respiratory status: spontaneous breathing, nonlabored ventilation, respiratory function stable and patient connected to nasal cannula oxygen Cardiovascular status: blood pressure returned to baseline and stable Postop Assessment: no signs of nausea or vomiting Anesthetic complications: no       Last Vitals:  Vitals:   10/04/16 1327 10/04/16 1420  BP: (!) 142/71 (!) 151/75  Pulse: 73 75  Resp: 15 15  Temp: 36.8 C 36.6 C    Last Pain:  Vitals:   10/04/16 1420  TempSrc: Oral  PainSc:                  Riccardo Dubin

## 2016-10-04 NOTE — Anesthesia Procedure Notes (Signed)
Procedure Name: Intubation Date/Time: 10/04/2016 7:39 AM Performed by: Dione Booze Pre-anesthesia Checklist: Emergency Drugs available, Suction available, Patient being monitored and Patient identified Patient Re-evaluated:Patient Re-evaluated prior to inductionOxygen Delivery Method: Circle system utilized Preoxygenation: Pre-oxygenation with 100% oxygen Intubation Type: IV induction and Cricoid Pressure applied Laryngoscope Size: Mac and 4 Grade View: Grade II Tube type: Oral Tube size: 7.5 mm Number of attempts: 1 Airway Equipment and Method: Stylet Placement Confirmation: ETT inserted through vocal cords under direct vision and positive ETCO2 Secured at: 21 cm Tube secured with: Tape Dental Injury: Teeth and Oropharynx as per pre-operative assessment  Comments: Teeth same as preop

## 2016-10-04 NOTE — H&P (Signed)
Hannah Skinner is an 56 y.o. female.   Chief Complaint: obesity HPI: 56 yo female with obesity who has completed all requirements and is ready to proceed with sleeve gastrectomy. She had a high TSH level and has been restarted on synthroid.  Past Medical History:  Diagnosis Date  . Constipation   . GERD (gastroesophageal reflux disease)   . PONV (postoperative nausea and vomiting)   . Thyroid disease     Past Surgical History:  Procedure Laterality Date  . ANKLE SURGERY     patient has pins in place reported on 10-03-16  . CESAREAN SECTION    . COLONOSCOPY WITH PROPOFOL N/A 03/15/2016   Procedure: COLONOSCOPY WITH PROPOFOL;  Surgeon: Wonda Horner, MD;  Location: [facility] ENDOSCOPY;  Service: Endoscopy;  Laterality: N/A;  . HERNIA REPAIR      Family History  Problem Relation Age of Onset  . Multiple sclerosis Father    Social History:  reports that she has never smoked. She has never used smokeless tobacco. She reports that she does not drink alcohol or use drugs.  Allergies: No Known Allergies  Medications Prior to Admission  Medication Sig Dispense Refill  . loratadine (CLARITIN) 10 MG tablet Take 10 mg by mouth at bedtime.       Results for orders placed or performed during the hospital encounter of 10/04/16 (from the past 48 hour(s))  Pregnancy, urine STAT morning of surgery     Status: None   Collection Time: 10/04/16  5:37 AM  Result Value Ref Range   Preg Test, Ur NEGATIVE NEGATIVE    Comment:        THE SENSITIVITY OF THIS METHODOLOGY IS >20 mIU/mL.    No results found.  Review of Systems  Constitutional: Negative for chills and fever.  HENT: Negative for hearing loss.   Eyes: Negative for blurred vision and double vision.  Respiratory: Negative for cough and hemoptysis.   Cardiovascular: Negative for chest pain and palpitations.  Gastrointestinal: Negative for abdominal pain, nausea and vomiting.  Genitourinary: Negative for dysuria and urgency.   Musculoskeletal: Negative for myalgias and neck pain.  Skin: Negative for itching and rash.  Neurological: Negative for dizziness, tingling and headaches.  Endo/Heme/Allergies: Does not bruise/bleed easily.  Psychiatric/Behavioral: Negative for depression and suicidal ideas.    Blood pressure 132/74, pulse 85, temperature 97.5 F (36.4 C), temperature source Oral, resp. rate 18, height 5\' 4"  (1.626 m), weight (!) 138.3 kg (305 lb), SpO2 97 %. Physical Exam  Vitals reviewed. Constitutional: She is oriented to person, place, and time. She appears well-developed and well-nourished.  HENT:  Head: Normocephalic and atraumatic.  Eyes: Conjunctivae and EOM are normal. Pupils are equal, round, and reactive to light.  Neck: Normal range of motion. Neck supple.  Cardiovascular: Normal rate and regular rhythm.   Respiratory: Effort normal and breath sounds normal.  GI: Soft. Bowel sounds are normal. She exhibits no distension. There is no tenderness.  Musculoskeletal: Normal range of motion.  Neurological: She is alert and oriented to person, place, and time.  Skin: Skin is warm and dry.  Psychiatric: She has a normal mood and affect. Her behavior is normal.     Assessment/Plan 56 yo female with morbid obesity -sleeve gastrectomy -ERAS protocol  [name], MD 10/04/2016, 7:00 AM

## 2016-10-04 NOTE — Op Note (Addendum)
Preop Diagnosis: Obesity Class III  Postop Diagnosis: same  Procedure performed: laparoscopic Sleeve Gastrectomy, laparoscopic hiatal hernia repair  Assitant: Adonis Housekeeper  Indications:  The patient is a 56 y.o. year-old morbidly obese female who has been followed in the Bariatric Clinic as an outpatient. This patient was diagnosed with morbid obesity with a BMI of Body mass index is 52.35 kg/m. and significant co-morbidities including hypertension and hypothyroid.  The patient was counseled extensively in the Bariatric Outpatient Clinic and after a thorough explanation of the risks and benefits of surgery (including death from complications, bowel leak, infection such as peritonitis and/or sepsis, internal hernia, bleeding, need for blood transfusion, bowel obstruction, organ failure, pulmonary embolus, deep venous thrombosis, wound infection, incisional hernia, skin breakdown, and others entailed on the consent form) and after a compliant diet and exercise program, the patient was scheduled for an elective laparoscopic sleeve gastrectomy.  Description of Operation:  Following informed consent, the patient was taken to the operating room and placed on the operating table in the supine position.  She had previously received prophylactic antibiotics and subcutaneous heparin for DVT prophylaxis in the pre-op holding area.  After induction of general endotracheal anesthesia by the anesthesiologist, the patient underwent placement of sequential compression devices, Foley catheter and an oro-gastric tube.  A timeout was confirmed by the surgery and anesthesia teams.  The patient was adequately padded at all pressure points and placed on a footboard to prevent slippage from the OR table during extremes of position during surgery.  She underwent a routine sterile prep and drape of her entire abdomen.    Next, A transverse incision was made under the left subcostal area and a 42mm optical viewing trocar was  introduced into the peritoneal cavity. Pneumoperitoneum was applied with a high flow and low pressure. A laparoscope was inserted to confirm placement. A extraperitoneal block was then placed at the lateral abdominal wall using exparel diluted with marcaine. 5 additional trocars were placed: 1 31mm trocar to the left of the midline. 1 additional 30mm trocar in the left lateral area, 1 16mm trocar in the right mid abdomen, and 1 53mm trocar in the right subcostal area.  The UGI showed a small hiatal hernia. Therefore, the pars flaccida was incised with harmonic scalpel. On initial viewing of the hiatus there was a large lipoma of the greater omentum herniated into the chest. This was easily reduced and decision was made to perform a 360 degree dissection due to hernia size. Therefore, beginning at the right crus the hernia sac was dissected free from the crus and this continued anteriorly and to the left crus. Next, the posterior crus connection was identified, blunt dissection was used to create a space between the posterior stomach and diaphragm and dissection of the filmy tissue continued to completely remove the hiatal hernia sac from the chest and hiatus. Once the sac was dissected at least 3cm of esophagus was seen in the abdomen so no lengthening or additional dissection was required. Due to the nature of the adjoining case, decision was made to not fully remove the entire fatty sac because there were multiple small blood vessels and the cardia would be completely dependent on the left gastric vessels after ligation of the gastroepiploic vessels. 5 0 ethibond sutures placed in interrupted fashion. A calibration tube was passed to ensure appropriate size of the hiatus.   The fat pad at the GE junction was incised and the gastrodiaphragmatic ligament was divided using the Harmonic scalpel. Next,  a hole was created through the lesser omentum along the greater curve of the stomach to enter the lesser sac. The  vessels along the greater omentum were  Then ligated and divided using the Harmonic scalpel moving towards the spleen and then short gastric vessels were ligated and divided in the same fashion to fully mobilize the fundus. The left crus was identified to ensure completion of the dissection. Next the antrum was measured and dissection continued inferiorly along the greater curve towards the pylorus and stopped 6cm from the pylorus.   A 40Fr ViSiGi dilator was placed into the esophgaus and along the lesser curve of the stomach and placed on suction. A 2mm 4-38mm tristapler was used to begin the resection along the antrum being sure to stay well away from the angularis by angling the jaws of the stapler towards the greater curve. An additional 63mm 4-87mm tristapler was used to continue the dissection. Then multiple 21mm 3-59mm tristapler loads were used to complete the resection staying along the [place] and ensuring the fundus was not retained by appropriately retracting it lateral. Air was inserted through the [place] to perform a leak test showing no bubbles and a neutral lie of the stomach.  The assistant then went and performed an upper endoscopy and leak test. No bubbles were seen and the sleeve and antrum distended appropriately. The specimen was then placed in an endocatch bag and removed by the 49mm port. The fascia of the 70mm port was closed with a 0 vicryl by suture passer. Hemostasis was ensured. Pneumoperitoneum was evacuated, all ports were removed and all incisions closed with 4-0 monocryl suture in subcuticular fashion. Glue was put in place for dressing. The patient awoke from anesthesia and was brought to pacu in stable condition. All counts were correct.    Estimated blood loss: <45ml  Specimens:  Sleeve gastrectomy  Local Anesthesia: 50 ml Exparel:0.25% Marcaine mix  Post-Op Plan:       Pain Management: PO, prn      Antibiotics: Prophylactic      Anticoagulation: Prophylactic,  Starting now      Post Op Studies/Consults: Not applicable      Intended Discharge: within 48h      Intended Outpatient Follow-Up: Two Week      Intended Outpatient Studies: Not Applicable      Other: Not Applicable   Arta Bruce Kinsinger

## 2016-10-04 NOTE — Op Note (Signed)
Procedure: Upper GI endoscopy  Description of procedure: Upper GI endoscopy is performed at the completion of laparoscopic sleeve gastrectomy by Dr.  Kieth Brightly.  The video endoscope was introduced into the upper esophagus and then passed to the EG junction at about 40 cm. The esophagus appeared normal. The gastric sleeve was entered. The sleeve was tensely distended with air while the outlet was obstructed under saline irrigation by the operating surgeon. There was no evidence of leak. The staple line was intact and without bleeding. The scope was advanced to the antrum and pylorus visualized. There was no stricture or twisting or mucosal abnormality, and particularly no narrowing noted at the incisura.  The pouch was then desufflated and the scope withdrawn.  Edward Jolly MD, FACS  10/04/2016, 10:32 AM

## 2016-10-04 NOTE — Discharge Instructions (Signed)
° ° ° °GASTRIC BYPASS/SLEEVE ° Home Care Instructions ° ° These instructions are to help you care for yourself when you go home. ° °Call: If you have any problems. °• Call [phone] and ask for the surgeon on call °• If you need immediate assistance come to the ER at [place]. Tell the ER staff you are a new post-op gastric bypass or gastric sleeve patient  °Signs and symptoms to report: • Severe  vomiting or nausea °o If you cannot handle clear liquids for longer than 1 day, call your surgeon °• Abdominal pain which does not get better after taking your pain medication °• Fever greater than 100.4°  F and chills °• Heart rate over 100 beats a minute °• Trouble breathing °• Chest pain °• Redness,  swelling, drainage, or foul odor at incision (surgical) sites °• If your incisions open or pull apart °• Swelling or pain in calf (lower leg) °• Diarrhea (Loose bowel movements that happen often), frequent watery, uncontrolled bowel movements °• Constipation, (no bowel movements for 3 days) if this happens: °o Take Milk of Magnesia, 2 tablespoons by mouth, 3 times a day for 2 days if needed °o Stop taking Milk of Magnesia once you have had a bowel movement °o Call your doctor if constipation continues °Or °o Take Miralax  (instead of Milk of Magnesia) following the label instructions °o Stop taking Miralax once you have had a bowel movement °o Call your doctor if constipation continues °• Anything you think is “abnormal for you” °  °Normal side effects after surgery: • Unable to sleep at night or unable to concentrate °• Irritability °• Being tearful (crying) or depressed ° °These are common complaints, possibly related to your anesthesia, stress of surgery, and change in lifestyle, that usually go away a few weeks after surgery. If these feelings continue, call your medical doctor.  °Wound Care: You may have surgical glue, steri-strips, or staples over your incisions after surgery °• Surgical glue: Looks like clear  film over your incisions and will wear off a little at a time °• Steri-strips: Adhesive strips of tape over your incisions. You may notice a yellowish color on skin under the steri-strips. This is used to make the steri-strips stick better. Do not pull the steri-strips off - let them fall off °• Staples: Staples may be removed before you leave the hospital °o If you go home with staples, call [facility] for an appointment with your surgeon’s nurse to have staples removed 10 days after surgery, [phone] °• Showering: You may shower two (2) days after your surgery unless your surgeon tells you differently °o Wash gently around incisions with warm soapy water, rinse well, and gently pat dry °o If you have a drain (tube from your incision), you may need someone to hold this while you shower °o No tub baths until staples are removed and incisions are healed °  °Medications: • Medications should be liquid or crushed if larger than the size of a dime °• Extended release pills (medication that releases a little bit at a time through the  day) should not be crushed °• Depending on the size and number of medications you take, you may need to space (take a few throughout the day)/change the time you take your medications so that you do not over-fill your pouch (smaller stomach) °• Make sure you follow-up with you primary care physician to make medication changes needed during rapid weight loss and life -style changes °•   If you have diabetes, follow up with your doctor that orders your diabetes medication(s) within one week after surgery and check your blood sugar regularly ° °• Do not drive while taking narcotics (pain medications) ° °• Do not take acetaminophen (Tylenol) and Roxicet or Lortab Elixir at the same time since these pain medications contain acetaminophen °  °Diet:  °First 2 Weeks You will see the nutritionist about two (2) weeks after your surgery. The nutritionist will increase the types of  foods you can eat if you are handling liquids well: °• If you have severe vomiting or nausea and cannot handle clear liquids lasting longer than 1 day call your surgeon °Protein Shake °• Drink at least 2 ounces of shake 5-6 times per day °• Each serving of protein shakes (usually 8-12 ounces) should have a minimum of: °o 15 grams of protein °o And no more than 5 grams of carbohydrate °• Goal for protein each day: °o Men = 80 grams per day °o Women = 60 grams per day °  ° • Protein powder may be added to fluids such as non-fat milk or Lactaid milk or Soy milk (limit to 35 grams added protein powder per serving) ° °Hydration °• Slowly increase the amount of water and other clear liquids as tolerated (See Acceptable Fluids) °• Slowly increase the amount of protein shake as tolerated °• Sip fluids slowly and throughout the day °• May use sugar substitutes in small amounts (no more than 6-8 packets per day; i.e. Splenda) ° °Fluid Goal °• The first goal is to drink at least 8 ounces of protein shake/drink per day (or as directed by the nutritionist); some examples of protein shakes are Syntrax Nectar, Adkins Advantage, EAS Edge HP, and Unjury. - See handout from pre-op Bariatric Education Class: °o Slowly increase the amount of protein shake you drink as tolerated °o You may find it easier to slowly sip shakes throughout the day °o It is important to get your proteins in first °• Your fluid goal is to drink 64-100 ounces of fluid daily °o It may take a few weeks to build up to this  °• 32 oz. (or more) should be clear liquids °And °• 32 oz. (or more) should be full liquids (see below for examples) °• Liquids should not contain sugar, caffeine, or carbonation ° °Clear Liquids: °• Water of Sugar-free flavored water (i.e. Fruit H²O, Propel) °• Decaffeinated coffee or tea (sugar-free) °• Crystal lite, Wyler’s Lite, Minute Maid Lite °• Sugar-free Jell-O °• Bouillon or broth °• Sugar-free Popsicle:    - Less than 20 calories  each; Limit 1 per day ° °Full Liquids: °                  Protein Shakes/Drinks + 2 choices per day of other full liquids °• Full liquids must be: °o No More Than 12 grams of Carbs per serving °o No More Than 3 grams of Fat per serving °• Strained low-fat cream soup °• Non-Fat milk °• Fat-free Lactaid Milk °• Sugar-free yogurt (Dannon Lite & Fit, Greek yogurt) ° °  °Vitamins and Minerals • Start 1 day after surgery unless otherwise directed by your surgeon °• 2 Chewable Multivitamin / Multimineral Supplement with iron (i.e. Centrum for Adults) °• Vitamin B-12, 350-500 micrograms sub-lingual (place tablet under the tongue) each day °• Chewable Calcium Citrate with Vitamin D-3 °(Example: 3 Chewable Calcium  Plus 600 with Vitamin D-3) °o Take 500 mg three (3) times a day for   a total of 1500 mg each day o Do not take all 3 doses of calcium at one time as it may cause constipation, and you can only absorb 500 mg at a time o Do not mix multivitamins containing iron with calcium supplements;  take 2 hours apart  Menstruating women and those at risk for anemia ( a blood disease that causes weakness) may need extra iron o Talk to your doctor to see if you need more iron  If you need extra iron: Total daily Iron recommendation (including Vitamins) is 50 to 100 mg Iron/day  Do not stop taking or change any vitamins or minerals until you talk to your nutritionist or surgeon  Your nutritionist and/or surgeon must approve all vitamin and mineral supplements   Activity and Exercise: It is important to continue walking at home. Limit your physical activity as instructed by your doctor. During this time, use these guidelines:  Do not lift anything greater than ten  (10) pounds for at least two (2) weeks  Do not go back to work or drive until Engineer, production says you can  You may have sex when you feel comfortable o It is VERY important for female patients to use a reliable birth control method; fertility often  increase after surgery o Do not get pregnant for at least 18 months  Start exercising as soon as your doctor tells you that you can o Make sure your doctor approves any physical activity  Start with a simple walking program  Walk 5-15 minutes each day, 7 days per week  Slowly increase until you are walking 30-45 minutes per day  Consider joining our [place] program. [phone] or email [email or web address]   Special Instructions Things to remember:  Free counseling is available for you and your family through collaboration between [facility] and [place]. Please call [phone] and leave a message  Use your CPAP when sleeping if this applies to you  Consider buying a medical alert bracelet that says you had lap-band surgery     You will likely have your first fill (fluid added to your band) 6 - 8 weeks after surgery  [facility] has a free Bariatric Surgery Support Group that meets monthly, the 3rd Thursday, [place]. You can see classes online at [email or web address]  It is very important to keep all follow up appointments with your surgeon, nutritionist, primary care physician, and behavioral health practitioner o After the first year, please follow up with your bariatric surgeon and nutritionist at least once a year in order to maintain best weight loss results                    [facility]:  [place]: [phone]               Bariatric Nurse Coordinator: [phone]  Gastric Bypass/Sleeve Home Care Instructions  Rev. 07/2012                                                         Reviewed and Vilinda Boehringer  by [facility] Patient Education Committee, Jan, 2014

## 2016-10-05 LAB — CBC WITH DIFFERENTIAL/PLATELET
Basophils Absolute: 0 10*3/uL (ref 0.0–0.1)
Basophils Relative: 0 %
EOS PCT: 0 %
Eosinophils Absolute: 0 10*3/uL (ref 0.0–0.7)
HCT: 36 % (ref 36.0–46.0)
Hemoglobin: 12 g/dL (ref 12.0–15.0)
LYMPHS ABS: 0.6 10*3/uL — AB (ref 0.7–4.0)
LYMPHS PCT: 8 %
MCH: 28.4 pg (ref 26.0–34.0)
MCHC: 33.3 g/dL (ref 30.0–36.0)
MCV: 85.1 fL (ref 78.0–100.0)
MONO ABS: 0.4 10*3/uL (ref 0.1–1.0)
MONOS PCT: 5 %
NEUTROS PCT: 87 %
Neutro Abs: 7.3 10*3/uL (ref 1.7–7.7)
PLATELETS: 245 10*3/uL (ref 150–400)
RBC: 4.23 MIL/uL (ref 3.87–5.11)
RDW: 13.5 % (ref 11.5–15.5)
WBC: 8.3 10*3/uL (ref 4.0–10.5)

## 2016-10-05 LAB — COMPREHENSIVE METABOLIC PANEL
ALT: 37 U/L (ref 14–54)
AST: 41 U/L (ref 15–41)
Albumin: 3.6 g/dL (ref 3.5–5.0)
Alkaline Phosphatase: 54 U/L (ref 38–126)
Anion gap: 8 (ref 5–15)
BUN: 13 mg/dL (ref 6–20)
CHLORIDE: 106 mmol/L (ref 101–111)
CO2: 23 mmol/L (ref 22–32)
CREATININE: 0.93 mg/dL (ref 0.44–1.00)
Calcium: 9 mg/dL (ref 8.9–10.3)
GFR calc non Af Amer: >60 mL/min (ref >60)
Glucose, Bld: 111 mg/dL — ABNORMAL HIGH (ref 65–99)
POTASSIUM: 4.2 mmol/L (ref 3.5–5.1)
Sodium: 137 mmol/L (ref 135–145)
Total Bilirubin: 0.6 mg/dL (ref 0.3–1.2)
Total Protein: 6.8 g/dL (ref 6.5–8.1)

## 2016-10-05 MED FILL — oxyCODONE HCL 5 MG/5ML SOLN: 5 | 4 days supply | Qty: 100 | Fill #0

## 2016-10-05 NOTE — Progress Notes (Addendum)
Patient alert and oriented, Post op day 1.  Provided support and encouragement.  Encouraged pulmonary toilet, ambulation and small sips of liquids. Protein shake began this am.   All questions answered.  Will continue to monitor.

## 2016-10-05 NOTE — Progress Notes (Signed)
Reviewed AVS with patient and husband.  Questions answered. Patient to discharge home via wheelchair with spouse.

## 2016-10-05 NOTE — Discharge Summary (Signed)
Physician Discharge Summary  Hannah Skinner [id number] DOB: 02-15-1961 DOA: 10/04/2016  PCP: Jonathon Bellows, MD  Admit date: 10/04/2016 Discharge date: 10/05/2016  Recommendations for Outpatient Follow-up:  1.  (include homehealth, outpatient follow-up instructions, specific recommendations for PCP to follow-up on, etc.)  Follow-up Information    [name], MD. Go on 10/21/2016.   Specialty:  General Surgery Why:  at 9 am Contact information: [place] [address] [phone]        [name], MD Follow up.   Specialty:  General Surgery Contact information: [place] [address] [phone]          Discharge Diagnoses:  Active Problems:   Morbid obesity ([place])   Surgical Procedure: Laparoscopic Sleeve Gastrectomy, upper endoscopy  Discharge Condition: Good Disposition: Home  Diet recommendation: Postoperative sleeve gastrectomy diet (liquids only)  Filed Weights   10/04/16 0526  Weight: (!) 138.3 kg (305 lb)     Hospital Course:  The patient was admitted after undergoing laparoscopic sleeve gastrectomy. POD 0 she ambulated well. POD 1 she was started on the water diet protocol and tolerated 500 ml in the first shift. Once meeting the water amount she was advanced to bariatric protein shakes which they tolerated and were discharged home POD 1.  Treatments: surgery: laparoscopic sleeve gastrectomy  Discharge Instructions  Discharge Instructions    Ambulate hourly while awake    Complete by:  As directed    Call MD for:  difficulty breathing, headache or visual disturbances    Complete by:  As directed    Call MD for:  persistant dizziness or light-headedness    Complete by:  As directed    Call MD for:  persistant nausea and vomiting    Complete by:  As directed    Call MD for:  redness, tenderness, or signs of infection (pain, swelling, redness, odor or green/yellow discharge  around incision site)    Complete by:  As directed    Call MD for:  severe uncontrolled pain    Complete by:  As directed    Call MD for:  temperature >101 F    Complete by:  As directed    Diet bariatric full liquid    Complete by:  As directed    Discharge wound care:    Complete by:  As directed    Remove Bandaids tomorrow, ok to shower tomorrow. Steristrips may fall off in 1-3 weeks.   Incentive spirometry    Complete by:  As directed    Perform hourly while awake     Allergies as of 10/05/2016   No Known Allergies     Medication List    TAKE these medications   loratadine 10 MG tablet Commonly known as:  CLARITIN Take 10 mg by mouth at bedtime.      Follow-up Information    [name], MD. Go on 10/21/2016.   Specialty:  General Surgery Why:  at 9 am Contact information: [place] [address] [phone]        [name], MD Follow up.   Specialty:  General Surgery Contact information: [place] [address] [phone]            The results of significant diagnostics from this hospitalization (including imaging, microbiology, ancillary and laboratory) are listed below for reference.    Significant Diagnostic Studies: No  results found.  Labs: Basic Metabolic Panel:  Recent Labs Lab 10/03/16 1115 10/04/16 0954 10/05/16 0500  NA 140  --  137  K 4.1  --  4.2  CL 107  --  106  CO2 24  --  23  GLUCOSE 98  --  111*  BUN 18  --  13  CREATININE 0.92 1.25* 0.93  CALCIUM 9.6  --  9.0   Liver Function Tests:  Recent Labs Lab 10/03/16 1115 10/05/16 0500  AST 24 41  ALT 21 37  ALKPHOS 70 54  BILITOT 0.6 0.6  PROT 8.0 6.8  ALBUMIN 4.5 3.6    CBC:  Recent Labs Lab 10/03/16 1115 10/04/16 0954 10/05/16 0500  WBC 5.7 7.1 8.3  NEUTROABS 3.8  --  7.3  HGB 13.3 12.7  12.6 12.0  HCT 38.9 37.7  37.5 36.0  MCV 81.7 82.9 85.1  PLT 286 247 245    CBG:  Recent  Labs Lab 10/03/16 1056  GLUCAP 105*    Active Problems:   Morbid obesity ([place])   Time coordinating discharge: <71min

## 2016-10-05 NOTE — Progress Notes (Signed)
Patient alert and oriented, pain is controlled. Patient is tolerating fluids, advanced to protein shake today, patient is tolerating well.  Patient has had a total of 6 ounces of protein at time of education without complication.  Reviewed Gastric sleeve discharge instructions with patient and patient is able to articulate understanding.  Provided information on BELT program, Support Group and [facility] outpatient pharmacy. All questions answered, will continue to monitor.

## 2016-10-13 ENCOUNTER — Telehealth (HOSPITAL_COMMUNITY): Payer: Self-pay

## 2016-10-13 NOTE — Telephone Encounter (Signed)
Made discharge phone call to patient. Asking the following questions.    1. Do you have someone to care for you now that you are home?  independent 2. Are you having pain now that is not relieved by your pain medication?  Doesn't need it 3. Are you able to drink the recommended daily amount of fluids (48 ounces minimum/day) and protein (60-80 grams/day) as prescribed by the dietitian or nutritional counselor?  50 ounces of fluid 30 grams of protein 4. Are you taking the vitamins and minerals as prescribed?  yes 5. Do you have the "on call" number to contact your surgeon if you have a problem or question?  yes 6. Are your incisions free of redness, swelling or drainage? (If steri strips, address that these can fall off, shower as tolerated) look good steri strips 7. Have your bowels moved since your surgery?  If not, are you passing gas?  yes 8. Are you up and walking 3-4 times per day?  yes 9. Were you provided your discharge medications before your surgery or before you were discharged from the hospital and are you taking them without problem?  yes

## 2016-10-18 ENCOUNTER — Encounter: Payer: Self-pay | Attending: General Surgery | Admitting: Skilled Nursing Facility1

## 2016-10-18 DIAGNOSIS — Z713 Dietary counseling and surveillance: Secondary | ICD-10-CM | POA: Insufficient documentation

## 2016-10-18 DIAGNOSIS — Z6841 Body Mass Index (BMI) 40.0 and over, adult: Secondary | ICD-10-CM | POA: Insufficient documentation

## 2016-10-19 ENCOUNTER — Encounter: Payer: Self-pay | Admitting: Skilled Nursing Facility1

## 2016-10-19 NOTE — Progress Notes (Signed)
Bariatric Class:  Appt start time: 1530 end time:  1630.  2 Week Post-Operative Nutrition Class  Patient was seen on 10/18/2016 for [facility]ative [facility]   Surgery date: 10/04/2016 Surgery type: Sleeve Gastrectomy  Start weight at [facility]: 312 Weight today: 294  TANITA  BODY COMP RESULTS  10/18/2016   BMI (kg/m^2) 50.5   Fat Mass (lbs) 156.2   Fat Free Mass (lbs) 137.8   Total Body Water (lbs) 102.4   The following the learning objectives were met by the patient during this course:  Identifies Phase 3A (Soft, High Proteins) Dietary Goals and will begin from 2 weeks post-operatively to 2 months post-operatively  Identifies appropriate sources of fluids and proteins   States protein recommendations and appropriate sources post-operatively  Identifies the need for appropriate texture modifications, mastication, and bite sizes when consuming solids  Identifies appropriate multivitamin and calcium sources post-operatively  Describes the need for physical activity post-operatively and will follow MD recommendations  States when to call healthcare provider regarding medication questions or post-operative complications  Handouts given during class include:  Phase 3A: Soft, High Protein Diet Handout  Follow-Up Plan: Patient will follow-up at [facility] in 6 weeks for 2 month post-op nutrition visit for diet advancement per MD.

## 2016-11-09 DIAGNOSIS — E039 Hypothyroidism, unspecified: Secondary | ICD-10-CM | POA: Diagnosis not present

## 2016-11-09 DIAGNOSIS — K912 Postsurgical malabsorption, not elsewhere classified: Secondary | ICD-10-CM | POA: Diagnosis not present

## 2016-11-09 DIAGNOSIS — E669 Obesity, unspecified: Secondary | ICD-10-CM | POA: Diagnosis not present

## 2016-11-09 DIAGNOSIS — Z9884 Bariatric surgery status: Secondary | ICD-10-CM | POA: Diagnosis not present

## 2016-11-09 DIAGNOSIS — R69 Illness, unspecified: Secondary | ICD-10-CM | POA: Diagnosis not present

## 2016-11-16 DIAGNOSIS — Z1231 Encounter for screening mammogram for malignant neoplasm of breast: Secondary | ICD-10-CM | POA: Diagnosis not present

## 2016-11-29 ENCOUNTER — Encounter: Payer: BLUE CROSS/BLUE SHIELD | Attending: General Surgery | Admitting: Skilled Nursing Facility1

## 2016-11-29 ENCOUNTER — Encounter: Payer: Self-pay | Admitting: Skilled Nursing Facility1

## 2016-11-29 DIAGNOSIS — Z6841 Body Mass Index (BMI) 40.0 and over, adult: Secondary | ICD-10-CM | POA: Insufficient documentation

## 2016-11-29 DIAGNOSIS — Z713 Dietary counseling and surveillance: Secondary | ICD-10-CM | POA: Diagnosis not present

## 2016-11-29 NOTE — Patient Instructions (Addendum)
-  Be sure to keep working on getting 64 fluid ounces ever day  -Try not to box yourself in and try new things: start with steamed broccoli   -Make sure to have your protein food first then start in on your vegetables

## 2016-11-29 NOTE — Progress Notes (Signed)
  Follow-up visit:  8 Weeks Post-Operative Sleeve Surgery  Primary concerns today: Post-operative Bariatric Surgery Nutrition Management.  Pt states she has been tracking with myfitness pal. Pt states if she is short on protein she will drink a protein shake.   Surgery date: 10/04/2016 Surgery type: Sleeve Gastrectomy  Start weight at [facility]: 312 Weight today: 294 Weight Change:  18.4  TANITA  BODY COMP RESULTS  10/18/2016 11/29/2016   BMI (kg/m^2) 50.5 47.3   Fat Mass (lbs) 156.2 140.8   Fat Free Mass (lbs) 137.8 134.8   Total Body Water (lbs) 102.4 99.4   24-hr recall: B (AM): 1 egg and 2 slices [place] bacon (31V) Snk (AM):  L (PM): chicken strips (5 ounces  Snk (PM): sugar free popcicle D (PM): hamburger with cheese Snk (PM):   Fluid intake: 30 ounces  Estimated total protein intake: 56g   Medications: See List Supplementation: opurity and calcium   Using straws: no Drinking while eating: no Having you been chewing well:no Chewing/swallowing difficulties: no Changes in vision: no Changes to mood/headaches: no Hair loss/Cahnges to skin/Changes to nails: no Any difficulty focusing or concentrating: no Sweating: no Dizziness/Lightheaded:  Palpitations: no  Carbonated beverages: no N/V/D/C/GAS: constipation Abdominal Pain: no Dumping syndrome: no  Recent physical activity:  Walking a mile every day (unless bad weather)  Progress Towards Goal(s):  In progress.  Handouts given during visit include:  Ns veggies + protein   Nutritional Diagnosis:  [place]-3.3 Overweight/obesity related to past poor dietary habits and physical inactivity as evidenced by patient w/ recent sleeve gastrecyomy surgery following dietary guidelines for continued weight loss.    Intervention:  Nutrition counseling. Dietitian educated the pt on advancing her die to include vegetables, the importance of vegetables, and the importance of meeting her fluid needs.  Goals: -Be sure to keep working  on getting 64 fluid ounces ever day -Try not to box yourself in and try new things: start with steamed broccoli  -Make sure to have your protein food first then start in on your vegetables   Teaching Method Utilized:  Visual Auditory Hands on  Barriers to learning/adherence to lifestyle change: distaste for vegetables   Demonstrated degree of understanding via:  Teach Back   Monitoring/Evaluation:  Dietary intake, exercise, lap band fills, and body weight.

## 2017-01-06 NOTE — Addendum Note (Signed)
Addendum  created 01/06/17 1316 by Lyndle Herrlich, MD   Sign clinical note

## 2017-01-06 NOTE — Anesthesia Postprocedure Evaluation (Signed)
Anesthesia Post Note  Patient: Hannah Skinner  Procedure(s) Performed: Procedure(s) (LRB): LAPAROSCOPIC GASTRIC SLEEVE RESECTION WITH HIATAL HERNIA REPAIR, UPPER ENDO (N/A)     Anesthesia Post Evaluation  Last Vitals:  Vitals:   10/05/16 0600 10/05/16 1026  BP: 99/81 116/67  Pulse: 70 75  Resp: 16 16  Temp: 36.6 C 36.4 C    Last Pain:  Vitals:   10/05/16 1026  TempSrc: Oral  PainSc:                  Riccardo Dubin

## 2017-01-17 DIAGNOSIS — M79604 Pain in right leg: Secondary | ICD-10-CM | POA: Diagnosis not present

## 2017-01-18 ENCOUNTER — Other Ambulatory Visit: Payer: Self-pay | Admitting: Family Medicine

## 2017-01-18 ENCOUNTER — Ambulatory Visit
Admission: RE | Admit: 2017-01-18 | Discharge: 2017-01-18 | Disposition: A | Discharge status: Home / Self Care | Payer: BLUE CROSS/BLUE SHIELD | Source: Ambulatory Visit | Attending: Family Medicine | Admitting: Family Medicine

## 2017-01-18 DIAGNOSIS — M79604 Pain in right leg: Secondary | ICD-10-CM

## 2017-01-30 ENCOUNTER — Encounter: Payer: BLUE CROSS/BLUE SHIELD | Attending: General Surgery | Admitting: Skilled Nursing Facility1

## 2017-01-30 DIAGNOSIS — Z713 Dietary counseling and surveillance: Secondary | ICD-10-CM | POA: Insufficient documentation

## 2017-01-30 DIAGNOSIS — Z6841 Body Mass Index (BMI) 40.0 and over, adult: Secondary | ICD-10-CM | POA: Insufficient documentation

## 2017-01-30 NOTE — Progress Notes (Signed)
  Follow-up visit:  4 months Post-Operative Sleeve Surgery  Primary concerns today: Post-operative Bariatric Surgery Nutrition Management.  Pt states she has been tracking with myfitness pal. Pt states if she is short on protein she will drink a protein shake.   Pt states her mood has stabilized and her energy is good.   Surgery date: 10/04/2016 Surgery type: Sleeve Gastrectomy  Start weight at [facility]: 312 Weight today: 256.6 Weight Change:  37.4  TANITA  BODY COMP RESULTS  10/18/2016 11/29/2016 01/30/2017   BMI (kg/m^2) 50.5 47.3 42.7   Fat Mass (lbs) 156.2 140.8 128   Fat Free Mass (lbs) 137.8 134.8 128.6   Total Body Water (lbs) 102.4 99.4 94.2   24-hr recall: B (AM): 1 egg and 2 slices [place] bacon (26R) or protein shake Snk (AM):  L (PM): chicken strips (5 ounces  Snk (PM): sugar free popcicle D (PM): hamburger with cheese maybe with some broccoli or lettuce Snk (PM):   Fluid intake: 55-60 ounces of water Estimated total protein intake: 60g   Medications: See List Supplementation: opurity and calcium   Using straws: no Drinking while eating: no Having you been chewing well:no Chewing/swallowing difficulties: no Changes in vision: no Changes to mood/headaches: no Hair loss/Changes to skin/Changes to nails:  A little thinning  Any difficulty focusing or concentrating: no Sweating: no Dizziness/Lightheaded:  Palpitations: no  Carbonated beverages: no N/V/D/C/GAS: some constipation but improving  Abdominal Pain: no Dumping syndrome: no  Recent physical activity:  Walking a mile every day (unless bad weather), weights 2 days a week, plays basketball with grandsons   Progress Towards Goal(s):  In progress.  Handouts given during visit include:  Ns veggies + protein   Nutritional Diagnosis:  [place]-3.3 Overweight/obesity related to past poor dietary habits and physical inactivity as evidenced by patient w/ recent sleeve gastrecyomy surgery following dietary  guidelines for continued weight loss.    Intervention:  Nutrition counseling. Dietitian educated the pt on advancing her die to include vegetables, the importance of vegetables, and the importance of meeting her fluid needs.  Goals: -Be sure to keep working on getting 64 fluid ounces ever day -Try not to box yourself in and try new things: start with steamed broccoli  -Make sure to have your protein food first then start in on your vegetables  -work on getting in high protein snacks aiming for 70-80 grams of protein  Teaching Method Utilized:  Visual Auditory Hands on  Barriers to learning/adherence to lifestyle change: distaste for vegetables   Demonstrated degree of understanding via:  Teach Back   Monitoring/Evaluation:  Dietary intake, exercise,and body weight.

## 2017-04-03 ENCOUNTER — Encounter: Payer: BLUE CROSS/BLUE SHIELD | Attending: General Surgery | Admitting: Skilled Nursing Facility1

## 2017-04-03 ENCOUNTER — Encounter: Payer: Self-pay | Admitting: Skilled Nursing Facility1

## 2017-04-03 DIAGNOSIS — Z713 Dietary counseling and surveillance: Secondary | ICD-10-CM | POA: Diagnosis not present

## 2017-04-03 DIAGNOSIS — Z6841 Body Mass Index (BMI) 40.0 and over, adult: Secondary | ICD-10-CM | POA: Insufficient documentation

## 2017-04-03 NOTE — Progress Notes (Signed)
  Follow-up visit:  4 months Post-Operative Sleeve Surgery  Primary concerns today: Post-operative Bariatric Surgery Nutrition Management.   Pt states she is frustrated with not continually losing weight.  Pt states she weighs herself every day. Pt states she is not a big fan of vegetables so she avoids them. Pt states sometimes she has overeaten which caused some pain in her stomach.   Surgery date: 10/04/2016 Surgery type: Sleeve Gastrectomy  Start weight at [facility]: 312 Weight today: 243.8 Weight Change:  12.8  TANITA  BODY COMP RESULTS  10/18/2016 11/29/2016 01/30/2017 04/03/2017   BMI (kg/m^2) 50.5 47.3 42.7 41.8   Fat Mass (lbs) 156.2 140.8 128 118.8   Fat Free Mass (lbs) 137.8 134.8 128.6 125   Total Body Water (lbs) 102.4 99.4 94.2 91.4   24-hr recall: B (AM): 1 egg and 2 slices [place] bacon (16X) or protein shake Snk (AM):  L (PM): chicken strips (5 ounces) and vegetable Snk (PM): sugar free popcicle D (PM): hamburger with cheese maybe with some broccoli or lettuce Snk (PM):   Fluid intake: 55-60 ounces of flavored water, protein shake Estimated total protein intake: 80g   Medications: See List Supplementation: opurity and calcium   Using straws: no Drinking while eating: no Having you been chewing well:no Chewing/swallowing difficulties: no Changes in vision: no Changes to mood/headaches: no Hair loss/Changes to skin/Changes to nails:  A little thinning  Any difficulty focusing or concentrating: no Sweating: no Dizziness/Lightheaded:  Palpitations: no  Carbonated beverages: no N/V/D/C/GAS: some constipation but improving (every couple days having a bowel movement) Abdominal Pain: no Dumping syndrome: no  Recent physical activity:  Walking 4 days a week 1 mile, weights somtimes, plays basketball with grandsons   Progress Towards Goal(s):  In progress.  Handouts given during visit include:  Ns veggies + protein   Nutritional Diagnosis:  Mina-3.3  Overweight/obesity related to past poor dietary habits and physical inactivity as evidenced by patient w/ recent sleeve gastrecyomy surgery following dietary guidelines for continued weight loss.    Intervention:  Nutrition counseling. Dietitian educated the pt on advancing her die to include vegetables, the importance of vegetables, and the importance of meeting her fluid needs.  Goals: -Keep working on getting in 64 fluid ounces a day -Continue to chew until applesauce consistency  -Continue to take bites the size of your thumb nail  -Continue to try new vegetables: cooked or raw, fresh, frozen, canned are all options   Teaching Method Utilized:  Visual Auditory Hands on  Barriers to learning/adherence to lifestyle change: distaste for vegetables   Demonstrated degree of understanding via:  Teach Back   Monitoring/Evaluation:  Dietary intake, exercise,and body weight.

## 2017-04-03 NOTE — Patient Instructions (Addendum)
-  Keep working on getting in 64 fluid ounces a day  -Continue to chew until applesauce consistency   -Continue to take bites the size of your thumb nail   -Continue to try new vegetables: cooked or raw, fresh, frozen, canned are all options

## 2017-04-12 DIAGNOSIS — K912 Postsurgical malabsorption, not elsewhere classified: Secondary | ICD-10-CM | POA: Diagnosis not present

## 2017-04-12 DIAGNOSIS — Z9884 Bariatric surgery status: Secondary | ICD-10-CM | POA: Diagnosis not present

## 2017-04-12 DIAGNOSIS — E669 Obesity, unspecified: Secondary | ICD-10-CM | POA: Diagnosis not present

## 2017-04-12 DIAGNOSIS — R69 Illness, unspecified: Secondary | ICD-10-CM | POA: Diagnosis not present

## 2017-04-18 DIAGNOSIS — E669 Obesity, unspecified: Secondary | ICD-10-CM | POA: Diagnosis not present

## 2017-04-18 DIAGNOSIS — Z9884 Bariatric surgery status: Secondary | ICD-10-CM | POA: Diagnosis not present

## 2017-04-18 DIAGNOSIS — R69 Illness, unspecified: Secondary | ICD-10-CM | POA: Diagnosis not present

## 2017-04-18 DIAGNOSIS — K912 Postsurgical malabsorption, not elsewhere classified: Secondary | ICD-10-CM | POA: Diagnosis not present

## 2017-10-16 ENCOUNTER — Other Ambulatory Visit (HOSPITAL_COMMUNITY)
Admission: RE | Admit: 2017-10-16 | Discharge: 2017-10-16 | Disposition: A | Discharge status: Home / Self Care | Payer: BLUE CROSS/BLUE SHIELD | Source: Ambulatory Visit | Attending: Family Medicine | Admitting: Family Medicine

## 2017-10-16 ENCOUNTER — Other Ambulatory Visit: Payer: Self-pay | Admitting: Family Medicine

## 2017-10-16 DIAGNOSIS — Z01411 Encounter for gynecological examination (general) (routine) with abnormal findings: Secondary | ICD-10-CM | POA: Insufficient documentation

## 2017-10-18 LAB — CYTOLOGY - PAP
DIAGNOSIS: NEGATIVE
HPV (WINDOPATH): NOT DETECTED

## 2018-01-24 ENCOUNTER — Telehealth: Payer: Self-pay | Admitting: Skilled Nursing Facility1

## 2018-01-24 NOTE — Telephone Encounter (Signed)
Pt states she doe snot like her chewable multivitamin.  Dietitian suggested she try capsule.

## 2018-12-14 IMAGING — RF DG UGI W/ KUB
11 series · 11 of 11 positions shown · non-contrast
Comparison: None.

CLINICAL DATA: Pre gastric sleeve surgery.

EXAM:
UPPER GI SERIES WITHOUT KUB
TECHNIQUE: Routine upper GI series was performed with thin barium.
FLUOROSCOPY TIME:  Fluoroscopy Time:  1 minutes 6 seconds
Radiation Exposure Index (if provided by the fluoroscopic device):
12 mGy

[Series 1: cp_standard · 0.25mm/px · 1 of 1 slices shown (1 of 11)]
[im 1/1]
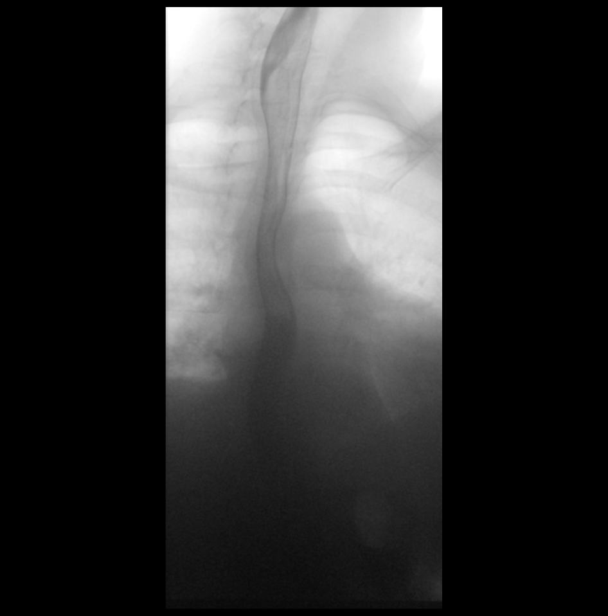

[Series 2: cp_standard · 0.17mm/px · 1 of 1 slices shown (2 of 11)]
[im 1/1]
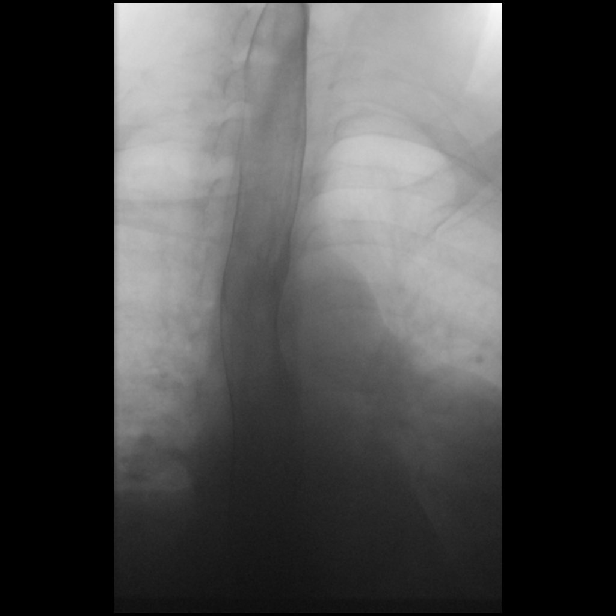

[Series 3: cp_standard · 0.17mm/px · 1 of 1 slices shown (3 of 11)]
[im 1/1]
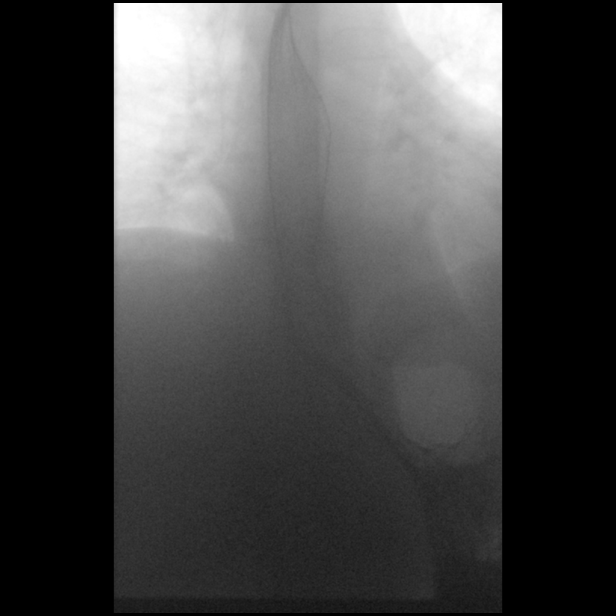

[Series 4: cp_standard · 0.17mm/px · 1 of 1 slices shown (4 of 11)]
[im 1/1]
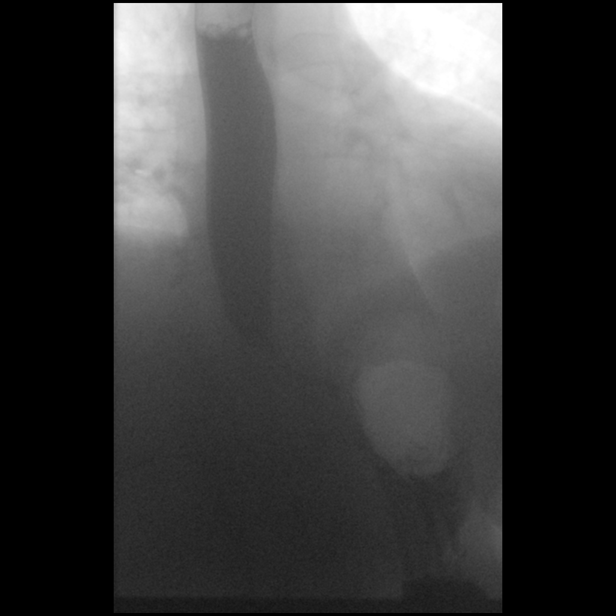

[Series 5: cp_standard · 0.29mm/px · 1 of 1 slices shown (5 of 11)]
[im 1/1]
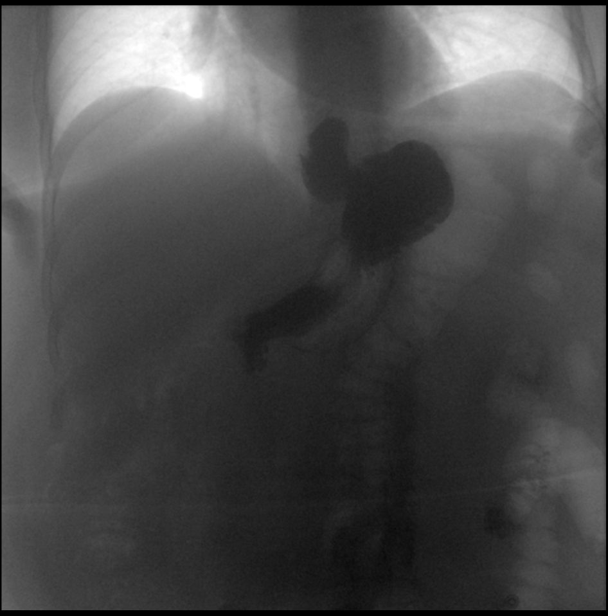

[Series 6: cp_standard · 0.19mm/px · 1 of 1 slices shown (6 of 11)]
[im 1/1]
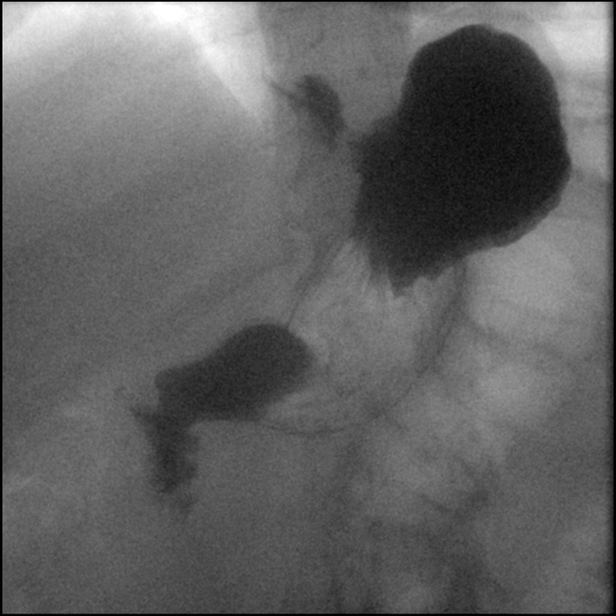

[Series 7: cp_standard · 0.19mm/px · 1 of 1 slices shown (7 of 11)]
[im 1/1]
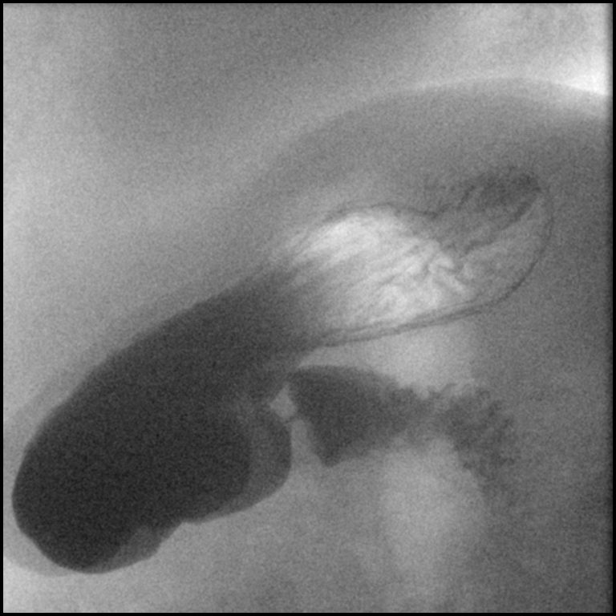

[Series 8: cp_standard · 0.19mm/px · 1 of 1 slices shown (8 of 11)]
[im 1/1]
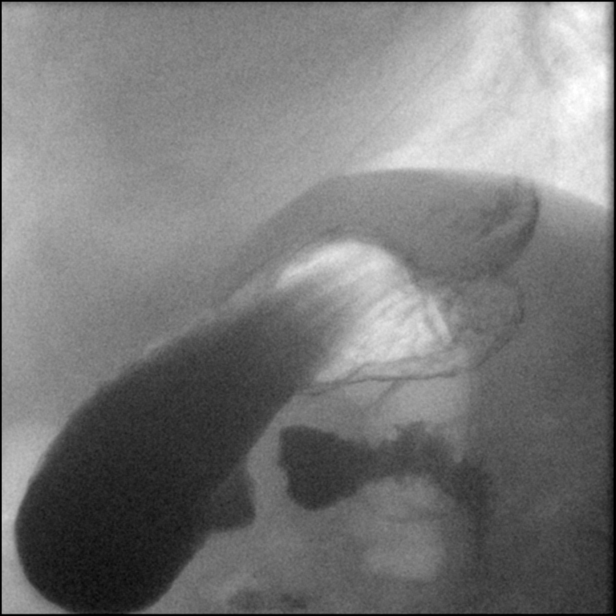

[Series 9: cp_standard · 0.19mm/px · 1 of 1 slices shown (9 of 11)]
[im 1/1]
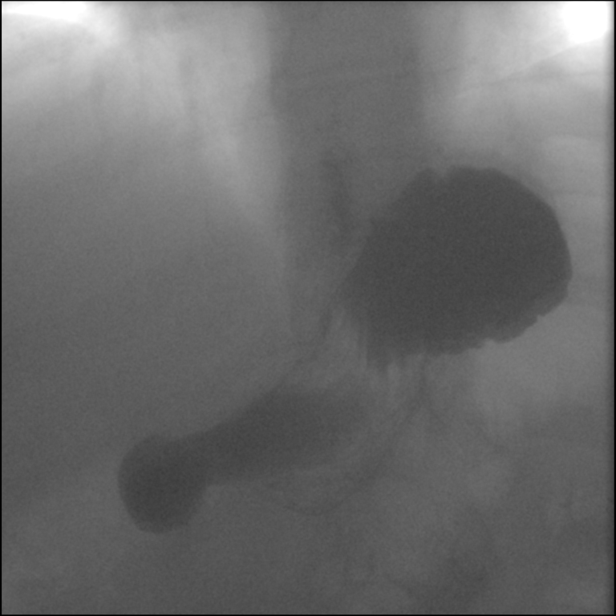

[Series 10: cp_standard · 0.19mm/px · 1 of 1 slices shown (10 of 11)]
[im 1/1]
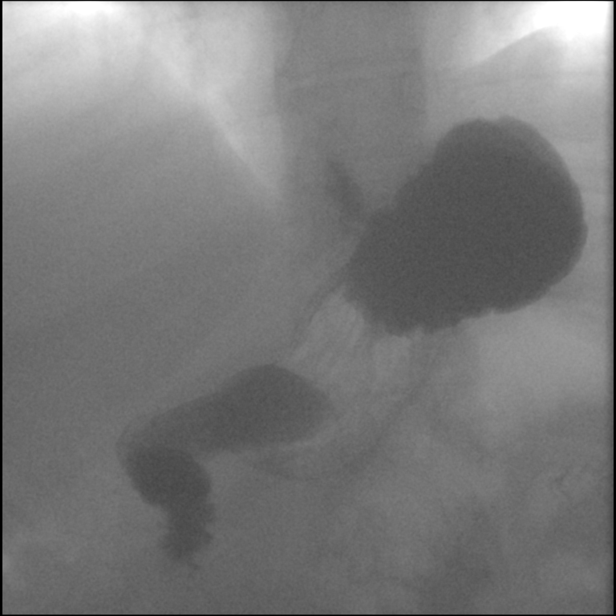

[Series 11: cp_standard · 0.20mm/px · 1 of 1 slices shown (11 of 11)]
[im 1/1]
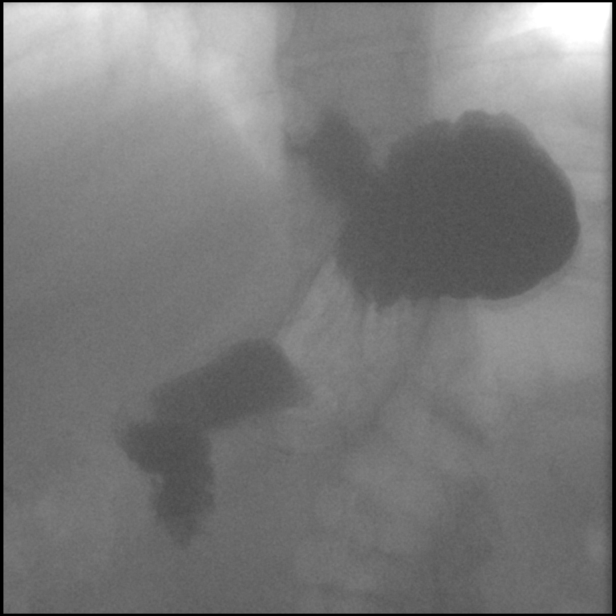

[11 of 11 positions shown; findings below may reference images not displayed]

FINDINGS: The esophagus appears patent. No stricture mass. Small hiatal
hernia. The stomach is otherwise normal. The duodenal bulb and
C-loop are unremarkable.
IMPRESSION: 1. Small hiatal hernia.  Examination is otherwise unremarkable.

## 2019-05-01 ENCOUNTER — Encounter (HOSPITAL_COMMUNITY): Payer: Self-pay

## 2019-05-03 DIAGNOSIS — Z9884 Bariatric surgery status: Secondary | ICD-10-CM | POA: Diagnosis not present

## 2019-05-10 DIAGNOSIS — E559 Vitamin D deficiency, unspecified: Secondary | ICD-10-CM | POA: Diagnosis not present

## 2019-05-10 DIAGNOSIS — E039 Hypothyroidism, unspecified: Secondary | ICD-10-CM | POA: Diagnosis not present

## 2019-05-10 DIAGNOSIS — E785 Hyperlipidemia, unspecified: Secondary | ICD-10-CM | POA: Diagnosis not present

## 2019-05-10 DIAGNOSIS — Z6838 Body mass index (BMI) 38.0-38.9, adult: Secondary | ICD-10-CM | POA: Diagnosis not present

## 2019-05-10 DIAGNOSIS — E538 Deficiency of other specified B group vitamins: Secondary | ICD-10-CM | POA: Diagnosis not present

## 2019-05-10 DIAGNOSIS — R7303 Prediabetes: Secondary | ICD-10-CM | POA: Diagnosis not present

## 2019-05-10 DIAGNOSIS — E611 Iron deficiency: Secondary | ICD-10-CM | POA: Diagnosis not present

## 2019-05-10 DIAGNOSIS — Z5181 Encounter for therapeutic drug level monitoring: Secondary | ICD-10-CM | POA: Diagnosis not present

## 2019-05-14 DIAGNOSIS — Z1231 Encounter for screening mammogram for malignant neoplasm of breast: Secondary | ICD-10-CM | POA: Diagnosis not present

## 2019-07-02 DIAGNOSIS — E039 Hypothyroidism, unspecified: Secondary | ICD-10-CM | POA: Diagnosis not present

## 2019-09-19 ENCOUNTER — Ambulatory Visit: Payer: Self-pay | Attending: Internal Medicine

## 2019-09-19 DIAGNOSIS — Z23 Encounter for immunization: Secondary | ICD-10-CM

## 2019-09-19 NOTE — Progress Notes (Signed)
   Covid-19 Vaccination Clinic  Name:  Hannah Skinner    MRN: [id number] DOB: [birth year]  09/19/2019  [name] was observed post Covid-19 immunization for 15 minutes without incident. She was provided with Vaccine Information Sheet and instruction to access the V-Safe system.   [name] was instructed to call 911 with any severe reactions post vaccine: Marland Kitchen Difficulty breathing  . Swelling of face and throat  . A fast heartbeat  . A bad rash all over body  . Dizziness and weakness   Immunizations Administered    Name Date Dose VIS Date Route   Moderna COVID-19 Vaccine 09/19/2019 10:47 AM 0.5 mL 05/28/2019 Intramuscular   Manufacturer: Moderna   Lot: [id number]   [id number]

## 2019-10-22 ENCOUNTER — Ambulatory Visit: Payer: Self-pay | Attending: Internal Medicine

## 2019-10-22 DIAGNOSIS — Z23 Encounter for immunization: Secondary | ICD-10-CM

## 2019-10-22 NOTE — Progress Notes (Signed)
   Covid-19 Vaccination Clinic  Name:  Hannah Skinner    MRN: [id number] DOB: 04-08-1961  10/22/2019  [name] was observed post Covid-19 immunization for 15 minutes without incident. She was provided with Vaccine Information Sheet and instruction to access the V-Safe system.   [name] was instructed to call 911 with any severe reactions post vaccine: Marland Kitchen Difficulty breathing  . Swelling of face and throat  . A fast heartbeat  . A bad rash all over body  . Dizziness and weakness   Immunizations Administered    Name Date Dose VIS Date Route   Moderna COVID-19 Vaccine 10/22/2019 10:24 AM 0.5 mL 05/2019 Intramuscular   Manufacturer: Moderna   Lot: [id number]   [id number]

## 2019-11-07 DIAGNOSIS — E039 Hypothyroidism, unspecified: Secondary | ICD-10-CM | POA: Diagnosis not present

## 2019-11-07 DIAGNOSIS — R7303 Prediabetes: Secondary | ICD-10-CM | POA: Diagnosis not present

## 2019-11-07 DIAGNOSIS — Z0000 Encounter for general adult medical examination without abnormal findings: Secondary | ICD-10-CM | POA: Diagnosis not present

## 2019-11-07 DIAGNOSIS — Z5181 Encounter for therapeutic drug level monitoring: Secondary | ICD-10-CM | POA: Diagnosis not present

## 2019-11-07 DIAGNOSIS — E559 Vitamin D deficiency, unspecified: Secondary | ICD-10-CM | POA: Diagnosis not present

## 2019-11-07 DIAGNOSIS — E785 Hyperlipidemia, unspecified: Secondary | ICD-10-CM | POA: Diagnosis not present

## 2020-05-14 DIAGNOSIS — E669 Obesity, unspecified: Secondary | ICD-10-CM | POA: Diagnosis not present

## 2020-05-14 DIAGNOSIS — Z9884 Bariatric surgery status: Secondary | ICD-10-CM | POA: Diagnosis not present

## 2020-05-19 DIAGNOSIS — Z1231 Encounter for screening mammogram for malignant neoplasm of breast: Secondary | ICD-10-CM | POA: Diagnosis not present

## 2020-11-10 DIAGNOSIS — Z23 Encounter for immunization: Secondary | ICD-10-CM | POA: Diagnosis not present

## 2020-11-10 DIAGNOSIS — R7303 Prediabetes: Secondary | ICD-10-CM | POA: Diagnosis not present

## 2020-11-10 DIAGNOSIS — Z0000 Encounter for general adult medical examination without abnormal findings: Secondary | ICD-10-CM | POA: Diagnosis not present

## 2020-11-10 DIAGNOSIS — E559 Vitamin D deficiency, unspecified: Secondary | ICD-10-CM | POA: Diagnosis not present

## 2020-11-10 DIAGNOSIS — E039 Hypothyroidism, unspecified: Secondary | ICD-10-CM | POA: Diagnosis not present

## 2020-11-10 DIAGNOSIS — Z5181 Encounter for therapeutic drug level monitoring: Secondary | ICD-10-CM | POA: Diagnosis not present

## 2020-11-10 DIAGNOSIS — E785 Hyperlipidemia, unspecified: Secondary | ICD-10-CM | POA: Diagnosis not present

## 2021-02-12 DIAGNOSIS — Z23 Encounter for immunization: Secondary | ICD-10-CM | POA: Diagnosis not present

## 2021-04-29 ENCOUNTER — Encounter (HOSPITAL_COMMUNITY): Payer: Self-pay | Admitting: *Deleted

## 2021-06-02 DIAGNOSIS — Z1231 Encounter for screening mammogram for malignant neoplasm of breast: Secondary | ICD-10-CM | POA: Diagnosis not present

## 2021-08-12 DIAGNOSIS — E785 Hyperlipidemia, unspecified: Secondary | ICD-10-CM | POA: Diagnosis not present

## 2021-08-12 DIAGNOSIS — E039 Hypothyroidism, unspecified: Secondary | ICD-10-CM | POA: Diagnosis not present

## 2021-08-12 DIAGNOSIS — R7303 Prediabetes: Secondary | ICD-10-CM | POA: Diagnosis not present

## 2021-08-12 DIAGNOSIS — E559 Vitamin D deficiency, unspecified: Secondary | ICD-10-CM | POA: Diagnosis not present

## 2021-08-17 DIAGNOSIS — Z9884 Bariatric surgery status: Secondary | ICD-10-CM | POA: Diagnosis not present

## 2021-08-17 DIAGNOSIS — E559 Vitamin D deficiency, unspecified: Secondary | ICD-10-CM | POA: Diagnosis not present

## 2021-08-17 DIAGNOSIS — Z79899 Other long term (current) drug therapy: Secondary | ICD-10-CM | POA: Diagnosis not present

## 2021-08-17 DIAGNOSIS — E785 Hyperlipidemia, unspecified: Secondary | ICD-10-CM | POA: Diagnosis not present

## 2021-08-17 DIAGNOSIS — R7303 Prediabetes: Secondary | ICD-10-CM | POA: Diagnosis not present

## 2021-08-17 DIAGNOSIS — E039 Hypothyroidism, unspecified: Secondary | ICD-10-CM | POA: Diagnosis not present

## 2021-08-26 DIAGNOSIS — Z9884 Bariatric surgery status: Secondary | ICD-10-CM | POA: Diagnosis not present

## 2021-09-27 ENCOUNTER — Encounter: Payer: Self-pay | Admitting: Skilled Nursing Facility1

## 2021-09-27 ENCOUNTER — Encounter: Payer: BC Managed Care – PPO | Attending: General Surgery | Admitting: Skilled Nursing Facility1

## 2021-09-27 DIAGNOSIS — Z6838 Body mass index (BMI) 38.0-38.9, adult: Secondary | ICD-10-CM | POA: Insufficient documentation

## 2021-09-27 NOTE — Progress Notes (Signed)
Bariatric Nutrition Follow-Up Visit ?Medical Nutrition Therapy  ? ? ?NUTRITION ASSESSMENT ?  ? ?Anthropometrics  ?Surgery date: 10/04/2016 ?Surgery type: Sleeve Gastrectomy  ?Start weight at [facility]: 312 pounds ?Weight today: 233.3 pounds ? ?Body Composition Scale 09/27/2021  ?Weight  lbs 233.3  ?Total Body Fat  % 43.8  ?   Visceral Fat 15  ?Fat-Free Mass  % 56.1  ?   Total Body Water  % 42.5  ?   Muscle-Mass  lbs 31.3  ?BMI 38.5  ?Body Fat Displacement ---  ?      Torso  lbs 63.4  ?      Left Leg  lbs 12.6  ?      Right Leg  lbs 12.6  ?      Left Arm  lbs 6.3  ?      Right Arm  lbs 6.3  ? ?Clinical  ?Medical hx: thyroid ?Medications: levothyroxine  ?Labs: none in EMR ?  ?Lifestyle & Dietary Hx ? ?Pt states she has a bowel movement every 2-3 days.   ?Pt states she has had some acid reflux but not a lot. Pt states sometimes she feels she has a cramp in her (stomach once a month).  ?Pt states her energy level is okay sometimes needing naps. Pt states her sleep has not been good stating she has trouble shutting her mind off. Pt sates her stress level has been high with a lot of stuff going on with home and work stating her mother has dementia (she is not her care giver).  ?Pt states she has reflux occasionally ?Pt states she sometimes skips lunch, but if she eats lunch, sometimes she skips dinner ?Pt states she usually gets 2 miles on the treadmill 4-5 days a week, but does not get out of breath. ?Pt states she does not like most vegetables.  Pt states she will eat carrots, broccoli, cauliflower, and ice burg lettuce. ?Pt states she does not sleep well and never have. ?Pt stated she has all handouts from bariatric classes and appointments at home and can refer to them. ? ?Pt states she does not have a regular lunch time and feels her evening meal is too large.  ? ?Estimated daily fluid intake: 50-70 oz ?Estimated daily protein intake: 80 g ?Supplements: multivitamin, fiber, calcium, vitamin B12, biotin ?Current average  weekly physical activity: 4-5 days a week 2 miles no incline unknown speed not getting out of breath ? ?24-Hr Dietary Recall: typically wakes around 7am; typically 2 meals a day ?First Meal 8:30-9: 1 egg + [place] bacon ?Snack:  sometimes goldfish  ?Second Meal 1:30-2: fried chicken nuggets + sometimes fries or fried chicken strips or [place] burger + cheese no bun or skipped ?Snack:  sugar free popsicle ?Third Meal:  ?Snack: protein shake and sugar free pudding  ?Beverages: hot chocolate, water ? ?Post-Op Goals/ Signs/ Symptoms ?Using straws: no ?Drinking while eating: yes ?Chewing/swallowing difficulties: no ?Changes in vision: no ?Changes to mood/headaches: no ?Hair loss/changes to skin/nails: some hair thinning  ?Difficulty focusing/concentrating: no ?Sweating: no ?Limb weakness: no ?Dizziness/lightheadedness: no ?Palpitations: no  ?Carbonated/caffeinated beverages: yes ?N/V/D/C/Gas: Constipation  ?Abdominal pain: no ?Dumping syndrome: no ? ?  ?NUTRITION DIAGNOSIS  ?Overweight/obesity (Advance-3.3) related to past poor dietary habits and physical inactivity as evidenced by completed bariatric surgery and following dietary guidelines for continued weight loss and healthy nutrition status. ?  ?  ?NUTRITION INTERVENTION ?Nutrition counseling (C-1) and education (E-2) to facilitate bariatric surgery goals, including: ?The importance of  consuming adequate calories as well as certain nutrients daily due to the body's need for essential vitamins, minerals, and fats ?The importance of daily physical activity and to reach a goal of at least 150 minutes of moderate to vigorous physical activity weekly (or as directed by their physician) due to benefits such as increased musculature and improved lab values ?The importance of intuitive eating specifically learning hunger-satiety cues and understanding the importance of learning a new body: The importance of mindful eating to avoid grazing behaviors ?The importance of eating lean  protein, complex carbohydrates, and non-starchy vegetables 3 meals a day, eating frequently throughout the day. ? ?Goals: ?-Eat lunch from home every day:  Salad: 1/2 cup Iceburg lettuce- 1/4c carrots, 1/4c broccoli, 1/4c cauliflower-3oz chicken or ham-1/4 cup of nuts or seeds and then a piece of fruit on the side OR lunch meat and cheese roll up + 1 piece of fruit + 1 cup broccoli OR 1 tuna in a wrap + 1 cup carrots  ?-aim for 2-3 days a  week of resistance ?-goal of cardio is to get sweaty and out of breath for 30 minutes 6 days a week ? ?Handouts Provided Include  ?Goals printed out, with meal ideas and serving sizes ? ?Learning Style & Readiness for Change ?Teaching method utilized: Visual & Auditory  ?Demonstrated degree of understanding via: Teach Back  ?Readiness Level: contemplative  ?Barriers to learning/adherence to lifestyle change: feeling overwhelmed and stressed with work and personal life ? ?RD's Notes for Next Visit ?Assess adherence to pt chosen goals  ? ? ?MONITORING & EVALUATION ?Dietary intake, weekly physical activity, body weight ? ?Next Steps ?Patient is to follow-up as needed ?

## 2021-12-09 DIAGNOSIS — E039 Hypothyroidism, unspecified: Secondary | ICD-10-CM | POA: Diagnosis not present

## 2021-12-09 DIAGNOSIS — E785 Hyperlipidemia, unspecified: Secondary | ICD-10-CM | POA: Diagnosis not present

## 2021-12-13 DIAGNOSIS — E785 Hyperlipidemia, unspecified: Secondary | ICD-10-CM | POA: Diagnosis not present

## 2021-12-13 DIAGNOSIS — Z0000 Encounter for general adult medical examination without abnormal findings: Secondary | ICD-10-CM | POA: Diagnosis not present

## 2021-12-13 DIAGNOSIS — E039 Hypothyroidism, unspecified: Secondary | ICD-10-CM | POA: Diagnosis not present

## 2021-12-13 DIAGNOSIS — M25551 Pain in right hip: Secondary | ICD-10-CM | POA: Diagnosis not present

## 2022-03-15 DIAGNOSIS — E785 Hyperlipidemia, unspecified: Secondary | ICD-10-CM | POA: Diagnosis not present

## 2022-03-15 DIAGNOSIS — E039 Hypothyroidism, unspecified: Secondary | ICD-10-CM | POA: Diagnosis not present

## 2022-06-08 DIAGNOSIS — Z1231 Encounter for screening mammogram for malignant neoplasm of breast: Secondary | ICD-10-CM | POA: Diagnosis not present

## 2022-12-19 DIAGNOSIS — E785 Hyperlipidemia, unspecified: Secondary | ICD-10-CM | POA: Diagnosis not present

## 2022-12-19 DIAGNOSIS — R7303 Prediabetes: Secondary | ICD-10-CM | POA: Diagnosis not present

## 2022-12-19 DIAGNOSIS — E538 Deficiency of other specified B group vitamins: Secondary | ICD-10-CM | POA: Diagnosis not present

## 2022-12-19 DIAGNOSIS — Z6841 Body Mass Index (BMI) 40.0 and over, adult: Secondary | ICD-10-CM | POA: Diagnosis not present

## 2022-12-19 DIAGNOSIS — Z5181 Encounter for therapeutic drug level monitoring: Secondary | ICD-10-CM | POA: Diagnosis not present

## 2022-12-19 DIAGNOSIS — E039 Hypothyroidism, unspecified: Secondary | ICD-10-CM | POA: Diagnosis not present

## 2022-12-19 DIAGNOSIS — E559 Vitamin D deficiency, unspecified: Secondary | ICD-10-CM | POA: Diagnosis not present

## 2022-12-21 ENCOUNTER — Other Ambulatory Visit: Payer: Self-pay | Admitting: Family Medicine

## 2022-12-21 ENCOUNTER — Other Ambulatory Visit (HOSPITAL_COMMUNITY)
Admission: RE | Admit: 2022-12-21 | Discharge: 2022-12-21 | Disposition: A | Discharge status: Home / Self Care | Payer: BC Managed Care – PPO | Source: Ambulatory Visit | Attending: Family Medicine | Admitting: Family Medicine

## 2022-12-21 DIAGNOSIS — E039 Hypothyroidism, unspecified: Secondary | ICD-10-CM | POA: Diagnosis not present

## 2022-12-21 DIAGNOSIS — Z0000 Encounter for general adult medical examination without abnormal findings: Secondary | ICD-10-CM | POA: Diagnosis not present

## 2022-12-21 DIAGNOSIS — Z01411 Encounter for gynecological examination (general) (routine) with abnormal findings: Secondary | ICD-10-CM | POA: Insufficient documentation

## 2022-12-21 DIAGNOSIS — E785 Hyperlipidemia, unspecified: Secondary | ICD-10-CM | POA: Diagnosis not present

## 2022-12-21 DIAGNOSIS — Z124 Encounter for screening for malignant neoplasm of cervix: Secondary | ICD-10-CM | POA: Diagnosis not present

## 2022-12-27 LAB — CYTOLOGY - PAP
Comment: NEGATIVE
Diagnosis: NEGATIVE
High risk HPV: NEGATIVE

## 2023-04-20 ENCOUNTER — Encounter (HOSPITAL_COMMUNITY): Payer: Self-pay | Admitting: *Deleted

## 2023-06-14 DIAGNOSIS — Z1231 Encounter for screening mammogram for malignant neoplasm of breast: Secondary | ICD-10-CM | POA: Diagnosis not present

## 2023-06-29 DIAGNOSIS — E66812 Obesity, class 2: Secondary | ICD-10-CM | POA: Diagnosis not present

## 2023-06-29 DIAGNOSIS — Z9884 Bariatric surgery status: Secondary | ICD-10-CM | POA: Diagnosis not present

## 2023-06-29 DIAGNOSIS — E569 Vitamin deficiency, unspecified: Secondary | ICD-10-CM | POA: Diagnosis not present

## 2023-07-07 DIAGNOSIS — K0889 Other specified disorders of teeth and supporting structures: Secondary | ICD-10-CM | POA: Diagnosis not present

## 2024-01-04 DIAGNOSIS — Z87898 Personal history of other specified conditions: Secondary | ICD-10-CM | POA: Diagnosis not present

## 2024-01-04 DIAGNOSIS — E559 Vitamin D deficiency, unspecified: Secondary | ICD-10-CM | POA: Diagnosis not present

## 2024-01-04 DIAGNOSIS — Z9884 Bariatric surgery status: Secondary | ICD-10-CM | POA: Diagnosis not present

## 2024-01-04 DIAGNOSIS — Z131 Encounter for screening for diabetes mellitus: Secondary | ICD-10-CM | POA: Diagnosis not present

## 2024-01-04 DIAGNOSIS — E785 Hyperlipidemia, unspecified: Secondary | ICD-10-CM | POA: Diagnosis not present

## 2024-01-08 DIAGNOSIS — Z0000 Encounter for general adult medical examination without abnormal findings: Secondary | ICD-10-CM | POA: Diagnosis not present

## 2024-01-08 DIAGNOSIS — Z23 Encounter for immunization: Secondary | ICD-10-CM | POA: Diagnosis not present
